# Patient Record
Sex: Female | Born: 1945 | Race: White | Hispanic: No | Marital: Single | Smoking: Former smoker
Health system: Southern US, Community
[De-identification: ages and names within clinical notes are randomized; demographics above are authoritative.]

## PROBLEM LIST (undated history)

## (undated) DIAGNOSIS — I10 Essential (primary) hypertension: Secondary | ICD-10-CM

## (undated) DIAGNOSIS — E785 Hyperlipidemia, unspecified: Secondary | ICD-10-CM

## (undated) DIAGNOSIS — I639 Cerebral infarction, unspecified: Secondary | ICD-10-CM

## (undated) DIAGNOSIS — J449 Chronic obstructive pulmonary disease, unspecified: Secondary | ICD-10-CM

## (undated) DIAGNOSIS — I2510 Atherosclerotic heart disease of native coronary artery without angina pectoris: Secondary | ICD-10-CM

## (undated) HISTORY — DX: Atherosclerotic heart disease of native coronary artery without angina pectoris: I25.10

## (undated) HISTORY — DX: Essential (primary) hypertension: I10

---

## 2013-09-20 ENCOUNTER — Encounter (HOSPITAL_COMMUNITY): Payer: Self-pay | Admitting: *Deleted

## 2013-09-20 ENCOUNTER — Emergency Department (HOSPITAL_COMMUNITY): Payer: Medicaid - Out of State

## 2013-09-20 ENCOUNTER — Inpatient Hospital Stay (HOSPITAL_COMMUNITY)
Admission: EM | Admit: 2013-09-20 | Discharge: 2013-09-25 | DRG: 247 | Disposition: A | Discharge status: Home / Self Care | Payer: Medicaid - Out of State | Attending: Internal Medicine | Admitting: Internal Medicine

## 2013-09-20 DIAGNOSIS — I2510 Atherosclerotic heart disease of native coronary artery without angina pectoris: Secondary | ICD-10-CM | POA: Diagnosis present

## 2013-09-20 DIAGNOSIS — Y831 Surgical operation with implant of artificial internal device as the cause of abnormal reaction of the patient, or of later complication, without mention of misadventure at the time of the procedure: Secondary | ICD-10-CM | POA: Diagnosis present

## 2013-09-20 DIAGNOSIS — E785 Hyperlipidemia, unspecified: Secondary | ICD-10-CM | POA: Diagnosis present

## 2013-09-20 DIAGNOSIS — Z7902 Long term (current) use of antithrombotics/antiplatelets: Secondary | ICD-10-CM

## 2013-09-20 DIAGNOSIS — N179 Acute kidney failure, unspecified: Secondary | ICD-10-CM | POA: Diagnosis present

## 2013-09-20 DIAGNOSIS — I252 Old myocardial infarction: Secondary | ICD-10-CM

## 2013-09-20 DIAGNOSIS — I4949 Other premature depolarization: Secondary | ICD-10-CM | POA: Diagnosis not present

## 2013-09-20 DIAGNOSIS — J4489 Other specified chronic obstructive pulmonary disease: Secondary | ICD-10-CM | POA: Diagnosis present

## 2013-09-20 DIAGNOSIS — J449 Chronic obstructive pulmonary disease, unspecified: Secondary | ICD-10-CM | POA: Diagnosis present

## 2013-09-20 DIAGNOSIS — N189 Chronic kidney disease, unspecified: Secondary | ICD-10-CM | POA: Diagnosis present

## 2013-09-20 DIAGNOSIS — Z86718 Personal history of other venous thrombosis and embolism: Secondary | ICD-10-CM

## 2013-09-20 DIAGNOSIS — R079 Chest pain, unspecified: Secondary | ICD-10-CM

## 2013-09-20 DIAGNOSIS — Z79899 Other long term (current) drug therapy: Secondary | ICD-10-CM

## 2013-09-20 DIAGNOSIS — Z7982 Long term (current) use of aspirin: Secondary | ICD-10-CM

## 2013-09-20 DIAGNOSIS — I214 Non-ST elevation (NSTEMI) myocardial infarction: Principal | ICD-10-CM | POA: Diagnosis present

## 2013-09-20 DIAGNOSIS — I129 Hypertensive chronic kidney disease with stage 1 through stage 4 chronic kidney disease, or unspecified chronic kidney disease: Secondary | ICD-10-CM | POA: Diagnosis present

## 2013-09-20 DIAGNOSIS — I10 Essential (primary) hypertension: Secondary | ICD-10-CM

## 2013-09-20 DIAGNOSIS — E876 Hypokalemia: Secondary | ICD-10-CM | POA: Diagnosis not present

## 2013-09-20 DIAGNOSIS — Z87891 Personal history of nicotine dependence: Secondary | ICD-10-CM

## 2013-09-20 DIAGNOSIS — F411 Generalized anxiety disorder: Secondary | ICD-10-CM | POA: Diagnosis present

## 2013-09-20 DIAGNOSIS — T82897A Other specified complication of cardiac prosthetic devices, implants and grafts, initial encounter: Secondary | ICD-10-CM | POA: Diagnosis present

## 2013-09-20 DIAGNOSIS — Z9861 Coronary angioplasty status: Secondary | ICD-10-CM

## 2013-09-20 HISTORY — DX: Chronic obstructive pulmonary disease, unspecified: J44.9

## 2013-09-20 HISTORY — DX: Hyperlipidemia, unspecified: E78.5

## 2013-09-20 HISTORY — DX: Cerebral infarction, unspecified: I63.9

## 2013-09-20 LAB — COMPREHENSIVE METABOLIC PANEL
ALT: 8 U/L (ref 0–35)
AST: 13 U/L (ref 0–37)
Albumin: 3.8 g/dL (ref 3.5–5.2)
Alkaline Phosphatase: 98 U/L (ref 39–117)
CO2: 21 mEq/L (ref 19–32)
Calcium: 9.1 mg/dL (ref 8.4–10.5)
Chloride: 103 mEq/L (ref 96–112)
Creatinine, Ser: 0.99 mg/dL (ref 0.50–1.10)
GFR calc Af Amer: 67 mL/min — ABNORMAL LOW (ref >90)
GFR calc non Af Amer: 58 mL/min — ABNORMAL LOW (ref >90)
Glucose, Bld: 133 mg/dL — ABNORMAL HIGH (ref 70–99)
Sodium: 138 mEq/L (ref 135–145)
Total Bilirubin: 0.4 mg/dL (ref 0.3–1.2)

## 2013-09-20 LAB — CBC WITH DIFFERENTIAL/PLATELET
Basophils Absolute: 0 10*3/uL (ref 0.0–0.1)
Eosinophils Relative: 1 % (ref 0–5)
HCT: 40 % (ref 36.0–46.0)
Lymphocytes Relative: 27 % (ref 12–46)
Lymphs Abs: 2.1 10*3/uL (ref 0.7–4.0)
MCHC: 35.8 g/dL (ref 30.0–36.0)
MCV: 94.6 fL (ref 78.0–100.0)
Monocytes Absolute: 0.4 10*3/uL (ref 0.1–1.0)
Neutro Abs: 5.1 10*3/uL (ref 1.7–7.7)
Platelets: 181 10*3/uL (ref 150–400)
RBC: 4.23 MIL/uL (ref 3.87–5.11)
WBC: 7.8 10*3/uL (ref 4.0–10.5)

## 2013-09-20 MED ORDER — NITROGLYCERIN 0.4 MG SL SUBL: 0.4000 mg | SUBLINGUAL_TABLET | SUBLINGUAL | Status: DC | PRN

## 2013-09-20 MED ORDER — HEPARIN (PORCINE) IN NACL 100-0.45 UNIT/ML-% IJ SOLN
800.0000 [IU]/h | INTRAMUSCULAR | Status: DC
  Administered 2013-09-20: 700 [IU]/h via INTRAVENOUS
  Filled 2013-09-20 (×2): qty 250

## 2013-09-20 MED ORDER — IOHEXOL 350 MG/ML SOLN
100.0000 mL | Freq: Once | INTRAVENOUS | Status: AC | PRN
  Administered 2013-09-20: 100 mL via INTRAVENOUS

## 2013-09-20 MED ORDER — NITROGLYCERIN 0.4 MG SL SUBL
SUBLINGUAL_TABLET | SUBLINGUAL | Status: AC
  Administered 2013-09-20: 21:00:00
  Filled 2013-09-20: qty 25

## 2013-09-20 MED ORDER — ASPIRIN 81 MG PO CHEW
324.0000 mg | CHEWABLE_TABLET | Freq: Once | ORAL | Status: AC
  Administered 2013-09-20: 324 mg via ORAL
  Filled 2013-09-20: qty 4

## 2013-09-20 MED ORDER — HEPARIN BOLUS VIA INFUSION
3500.0000 [IU] | Freq: Once | INTRAVENOUS | Status: AC
  Administered 2013-09-20: 3500 [IU] via INTRAVENOUS
  Filled 2013-09-20: qty 3500

## 2013-09-20 NOTE — ED Notes (Signed)
The pt reports that she has too much iron in her blood

## 2013-09-20 NOTE — ED Notes (Signed)
Dr. Jodi Mourning aware of pt Troponin level- pt denies any chest pain at this time.  MD at bedside.  IV in place.

## 2013-09-20 NOTE — ED Notes (Signed)
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  She has had intermittent lt sided chest pain since then.  She was supposed to see a doctor in [place] where she lives but her sister wanted her to come here

## 2013-09-20 NOTE — ED Provider Notes (Signed)
CSN: [id number]     Arrival date & time 09/20/13  1918 History   First MD Initiated Contact with Patient 09/20/13 2116     Chief Complaint  Patient presents with  . Chest Pain   (Consider location/radiation/quality/duration/timing/severity/associated sxs/prior Treatment) Patient is a 67 y.o. female presenting with chest pain. The history is provided by the patient. No language interpreter was used.  Chest Pain Pain location:  Substernal area Pain quality: pressure   Pain radiates to:  L arm Pain radiates to the back: no   Pain severity:  Mild Onset quality:  Gradual Duration: intermittent for months but worse over the last 3 days. Timing:  Intermittent Progression:  Waxing and waning Context: at rest   Worsened by:  Nothing tried Associated symptoms: fatigue and shortness of breath   Associated symptoms: no abdominal pain, no diaphoresis, no dizziness, no fever, no nausea, no palpitations and not vomiting     Past Medical History  Diagnosis Date  . Hypertension   . Coronary artery disease   . Stroke   . MI (myocardial infarction)    History reviewed. No pertinent past surgical history. No family history on file. History  Substance Use Topics  . Smoking status: Former Games developer  . Smokeless tobacco: Not on file  . Alcohol Use: No   OB History   Grav Para Term Preterm Abortions TAB SAB Ect Mult Living                 Review of Systems  Constitutional: Positive for fatigue. Negative for fever, diaphoresis and activity change.  Respiratory: Positive for chest tightness and shortness of breath.   Cardiovascular: Positive for chest pain. Negative for palpitations and leg swelling.  Gastrointestinal: Negative for nausea, vomiting and abdominal pain.  Neurological: Negative for dizziness and syncope.  All other systems reviewed and are negative.    Allergies  Review of patient's allergies indicates no known allergies.  Home Medications   Current Outpatient Rx  Name   Route  Sig  Dispense  Refill  . aspirin 325 MG tablet   Oral   Take 325 mg by mouth daily.         . enalapril (VASOTEC) 10 MG tablet   Oral   Take 10 mg by mouth 2 (two) times daily.         . furosemide (LASIX) 20 MG tablet   Oral   Take 20 mg by mouth daily.         Marland Kitchen HYDROcodone-acetaminophen (NORCO) 10-325 MG per tablet   Oral   Take 1 tablet by mouth every 6 (six) hours as needed for pain.         . metoprolol (LOPRESSOR) 100 MG tablet   Oral   Take 100 mg by mouth 2 (two) times daily.         Marland Kitchen omeprazole (PRILOSEC) 20 MG capsule   Oral   Take 20 mg by mouth daily.         . pravastatin (PRAVACHOL) 40 MG tablet   Oral   Take 40 mg by mouth daily.         . temazepam (RESTORIL) 15 MG capsule   Oral   Take 15 mg by mouth at bedtime as needed for sleep.          BP 150/109  Pulse 64  Temp(Src) 98.4 F (36.9 C) (Oral)  Resp 18  Wt 128 lb 1.6 oz (58.106 kg)  SpO2 99% Physical Exam  Vitals  reviewed. Constitutional: She is oriented to person, place, and time. She appears well-developed and well-nourished. No distress.  HENT:  Head: Normocephalic.  Cardiovascular: Normal rate, regular rhythm, normal heart sounds and intact distal pulses.   Pulmonary/Chest: Effort normal. She has rales in the right lower field and the left lower field.  Abdominal: Soft. Bowel sounds are normal. She exhibits no distension. There is no tenderness.  Musculoskeletal: She exhibits no edema and no tenderness.       Right lower leg: She exhibits no tenderness and no swelling.       Left lower leg: She exhibits no tenderness and no swelling.  Neurological: She is alert and oriented to person, place, and time.  Skin: Skin is warm and dry.    ED Course  Procedures (including critical care time) Labs Review Labs Reviewed  COMPREHENSIVE METABOLIC PANEL - Abnormal; Notable for the following:    Glucose, Bld 133 (*)    GFR calc non Af Amer 58 (*)    GFR calc Af Amer 67  (*)    All other components within normal limits  TROPONIN I - Abnormal; Notable for the following:    Troponin I 3.18 (*)    All other components within normal limits  CBC WITH DIFFERENTIAL  TROPONIN I   Imaging Review Dg Chest 2 View  09/20/2013   CLINICAL DATA:  Three day history of chest pain and upper back pain. Shortness of breath. Former smoker. Current history of hypertension. Prior history of MI and DVT.  EXAM: CHEST  2 VIEW  COMPARISON:  None.  FINDINGS: Cardiac silhouette mildly enlarged. Thoracic aorta atherosclerotic. Hilar and mediastinal contours otherwise unremarkable. Emphysematous changes in the upper lobes. Scattered areas of linear atelectasis or scarring in the inferior right upper lobe, right middle lobe, lingula, and both lower lobes. No confluent airspace consolidation. Normal pulmonary vascularity. No pleural effusions. Old appearing compression fracture of what I believe is the T8 vertebral body on the order of 40% or so. Right coronary artery stent.  IMPRESSION: Mild cardiomegaly. Scattered areas of linear atelectasis and or scarring in both lungs. COPD/emphysema. No acute cardiopulmonary disease otherwise.   Electronically Signed   By: Hulan Saas   On: 09/20/2013 20:32    Date: 09/20/2013  Rate: 76  Rhythm: normal sinus rhythm  QRS Axis: normal  Intervals: QT prolonged  ST/T Wave abnormalities: normal  Conduction Disutrbances:none  Narrative Interpretation: sinus rhythm, QT prolonged, LVH  Old EKG Reviewed: none available   MDM  No diagnosis found.  67 y/o female with history of MI s/p stent placement presenting with chest pressure. Reports intermittent symptoms over the last 2 months that occur at rest. Denies exertional symptoms. Pressure occuring more frequently over the last 3 days. Denies infectious symptoms. She was seen at [facility] where she reported having LE [place] and was told she had DVTs but was started on no anticoagulation. No clinical evidence of  DVT on my exam. Troponin elevated 3.18. ASA given. CXR with mild cardiomegaly, no acute process. Cardiology consulted and heparin infusion began. CT PE study to r/o PE was negative. Patient admitted to Cardiology for further management of NSTEMI.   Labs and imaging reviewed in my medical decision making. Patient discussed with my attending, Dr. Jodi Mourning.    Abagail Kitchens, MD 09/21/13 0159  Medical screening examination/treatment/procedure(s) were conducted as a shared visit with non-physician practitioner(s) or resident  and myself.  I personally evaluated the patient during the encounter and agree with the findings  and plan unless otherwise indicated.    Cardiac hx.  Worsening cp recently. EKG reviewed with resident, no acute findings.  Exam lungs clear, RRR, no leg swelling/ pain.  Troponin elevated.  Cardiology consulted for concern for CAD/ NSTEMI.  CT pe neg. Heparin and admission.  Pain improved in ED.  NSTEMI,  Comfortable on recheck.   NSTEMI, Chest pani  Enid Skeens, MD 09/22/13 [phone]

## 2013-09-20 NOTE — Consult Note (Signed)
ANTICOAGULATION CONSULT NOTE - Initial Consult  Pharmacy Consult for Heparin Indication: chest pain/ACS  No Known Allergies  Patient Measurements: Height: 5' 0.5" (153.7 cm) Weight: 128 lb 1.6 oz (58.106 kg) IBW/kg (Calculated) : 46.65 Heparin Dosing Weight: 58kg  Vital Signs: Temp: 98.4 F (36.9 C) (09/26 1929) Temp src: Oral (09/26 1929) BP: 159/87 mmHg (09/26 2136) Pulse Rate: 64 (09/26 1929)  Labs:  Recent Labs  09/20/13 1936 09/20/13 1937  HGB 14.3  --   HCT 40.0  --   PLT 181  --   CREATININE 0.99  --   TROPONINI  --  3.18*    Estimated Creatinine Clearance: 44.7 ml/min (by C-G formula based on Cr of 0.99).   Medical History: Past Medical History  Diagnosis Date  . Hypertension   . Coronary artery disease   . Stroke   . MI (myocardial infarction)     Assessment: 67yof diagnosed with DVTs earlier this month (but only on aspirin pta, she says blood thinners hurt her stomach) presents to the ED with chest pain. Initial troponin is positive at 3.18. She will begin IV heparin. Baseline labs wnl.  Goal of Therapy:  Heparin level 0.3-0.7 units/ml Monitor platelets by anticoagulation protocol: Yes   Plan:  1) Heparin bolus 3500 units x 1 2) Heparin drip at 700 units/hr 3) 6 hour heparin level  4) Daily heparin level and CBC  Fredrik Rigger 09/20/2013,9:47 PM

## 2013-09-20 NOTE — H&P (Signed)
Savannah Carlson is an 67 y.o. female.   Chief Complaint: chronic chest pain, more fatigue  HPI: 68 yo woman with PMH of prior MI and 3 stents approximately 10 years ago, hypertension, here with 1-2 weeks of chest pressure typically at rest, not necessarily exertional who comes in today with continued chest pressure and associated worsening fatigue. She's had intermittent chest pain since her MI/stents in 1998 (at [facility]) but probably only 1-3x a month. Over the last 1-2 months she's had more She was recently seen in [place] at [facility] and was diagnosed with lower extremity blood clots according to patient but she was not started on anticoagulation. She gets very winded with a smothering feeling walking up the 1 flight of stairs she has to do walk up at her home. Otherwise, no syncope, no recent bleeding issues (she had issues have a gynecological surgery several years ago), no travel, no infections.   Past Medical History  Diagnosis Date  . Hypertension   . Coronary artery disease   . Stroke   . MI (myocardial infarction)     History reviewed. No pertinent past surgical history.  History reviewed. No pertinent family history. Social History:  reports that she has quit smoking. She does not have any smokeless tobacco history on file. She reports that she does not drink alcohol. Her drug history is not on file.  Allergies: No Known Allergies   (Not in a hospital admission) Current Facility-Administered Medications  Medication Dose Route Frequency Provider Last Rate Last Dose  . heparin ADULT infusion 100 units/mL (25000 units/250 mL)  700 Units/hr Intravenous Continuous Fredrik Rigger, [facility] 7 mL/hr at 09/20/13 2209 700 Units/hr at 09/20/13 2209  . nitroGLYCERIN (NITROSTAT) SL tablet 0.4 mg  0.4 mg Sublingual Q5 min PRN Abagail Kitchens, MD        Results for orders placed during the hospital encounter of 09/20/13 (from the past 48 hour(s))  CBC WITH DIFFERENTIAL     Status: None   Collection Time     09/20/13  7:36 PM      Result Value Range   WBC 7.8  4.0 - 10.5 K/uL   RBC 4.23  3.87 - 5.11 MIL/uL   Hemoglobin 14.3  12.0 - 15.0 g/dL   HCT 57.8  46.9 - 62.9 %   MCV 94.6  78.0 - 100.0 fL   MCH 33.8  26.0 - 34.0 pg   MCHC 35.8  30.0 - 36.0 g/dL   RDW 52.8  41.3 - 24.4 %   Platelets 181  150 - 400 K/uL   Neutrophils Relative % 66  43 - 77 %   Neutro Abs 5.1  1.7 - 7.7 K/uL   Lymphocytes Relative 27  12 - 46 %   Lymphs Abs 2.1  0.7 - 4.0 K/uL   Monocytes Relative 6  3 - 12 %   Monocytes Absolute 0.4  0.1 - 1.0 K/uL   Eosinophils Relative 1  0 - 5 %   Eosinophils Absolute 0.1  0.0 - 0.7 K/uL   Basophils Relative 0  0 - 1 %   Basophils Absolute 0.0  0.0 - 0.1 K/uL  COMPREHENSIVE METABOLIC PANEL     Status: Abnormal   Collection Time    09/20/13  7:36 PM      Result Value Range   Sodium 138  135 - 145 mEq/L   Potassium 3.8  3.5 - 5.1 mEq/L   Chloride 103  96 - 112 mEq/L   CO2  21  19 - 32 mEq/L   Glucose, Bld 133 (*) 70 - 99 mg/dL   BUN 22  6 - 23 mg/dL   Creatinine, Ser 4.09  0.50 - 1.10 mg/dL   Calcium 9.1  8.4 - 81.1 mg/dL   Total Protein 7.2  6.0 - 8.3 g/dL   Albumin 3.8  3.5 - 5.2 g/dL   AST 13  0 - 37 U/L   ALT 8  0 - 35 U/L   Alkaline Phosphatase 98  39 - 117 U/L   Total Bilirubin 0.4  0.3 - 1.2 mg/dL   GFR calc non Af Amer 58 (*) >90 mL/min   GFR calc Af Amer 67 (*) >90 mL/min   Comment: (NOTE)     The eGFR has been calculated using the CKD EPI equation.     This calculation has not been validated in all clinical situations.     eGFR's persistently <90 mL/min signify possible Chronic Kidney     Disease.  TROPONIN I     Status: Abnormal   Collection Time    09/20/13  7:37 PM      Result Value Range   Troponin I 3.18 (*) <0.30 ng/mL   Comment:            Due to the release kinetics of cTnI,     a negative result within the first hours     of the onset of symptoms does not rule out     myocardial infarction with certainty.     If myocardial infarction is  still suspected,     repeat the test at appropriate intervals.     REPEATED TO VERIFY     CRITICAL RESULT CALLED TO, READ BACK BY AND VERIFIED WITH:     B SANGLANG,RN 2100 09/20/13 WBOND   Dg Chest 2 View  09/20/2013   CLINICAL DATA:  Three day history of chest pain and upper back pain. Shortness of breath. Former smoker. Current history of hypertension. Prior history of MI and DVT.  EXAM: CHEST  2 VIEW  COMPARISON:  None.  FINDINGS: Cardiac silhouette mildly enlarged. Thoracic aorta atherosclerotic. Hilar and mediastinal contours otherwise unremarkable. Emphysematous changes in the upper lobes. Scattered areas of linear atelectasis or scarring in the inferior right upper lobe, right middle lobe, lingula, and both lower lobes. No confluent airspace consolidation. Normal pulmonary vascularity. No pleural effusions. Old appearing compression fracture of what I believe is the T8 vertebral body on the order of 40% or so. Right coronary artery stent.  IMPRESSION: Mild cardiomegaly. Scattered areas of linear atelectasis and or scarring in both lungs. COPD/emphysema. No acute cardiopulmonary disease otherwise.   Electronically Signed   By: Hulan Saas   On: 09/20/2013 20:32    Review of Systems  Constitutional: Positive for malaise/fatigue. Negative for fever and chills.  HENT: Positive for hearing loss. Negative for ear pain and neck pain.   Eyes: Negative for double vision, photophobia and pain.  Respiratory: Positive for shortness of breath. Negative for hemoptysis and sputum production.   Cardiovascular: Positive for chest pain and orthopnea. Negative for palpitations.  Gastrointestinal: Negative for nausea, vomiting and abdominal pain.  Genitourinary: Negative for urgency and frequency.  Musculoskeletal: Positive for back pain and joint pain. Negative for myalgias.  Skin: Negative for itching.  Neurological: Negative for dizziness, tingling, tremors and headaches.  Endo/Heme/Allergies:  Negative for polydipsia. Bruises/bleeds easily.  Psychiatric/Behavioral: Negative for suicidal ideas, hallucinations and substance abuse.    Blood pressure  159/87, pulse 64, temperature 98.4 F (36.9 C), temperature source Oral, resp. rate 18, height 5' 0.5" (1.537 m), weight 58.106 kg (128 lb 1.6 oz), SpO2 97.00%. Physical Exam  Nursing note and vitals reviewed. Constitutional: She is oriented to person, place, and time. She appears well-developed and well-nourished. No distress.  HENT:  Head: Normocephalic and atraumatic.  Nose: Nose normal.  Mouth/Throat: Oropharynx is clear and moist. No oropharyngeal exudate.  Eyes: Conjunctivae and EOM are normal. Pupils are equal, round, and reactive to light. No scleral icterus.  Neck: Normal range of motion. Neck supple. JVD present. No thyromegaly present.  JVD 3 cm above clavicle with slight HJR  Cardiovascular: Normal rate, regular rhythm, normal heart sounds and intact distal pulses.  Exam reveals no gallop.   No murmur heard. Respiratory: Effort normal. She has no rales.  Bibasilar rales that clear superiorly  GI: Soft. Bowel sounds are normal. There is tenderness. There is no rebound.  Mildly tender  Musculoskeletal: Normal range of motion. She exhibits tenderness. She exhibits no edema.  Mildly tender legs  Neurological: She is alert and oriented to person, place, and time. No cranial nerve deficit. Coordination normal.  Skin: Skin is warm and dry. No rash noted. She is not diaphoretic. No erythema.  Psychiatric: She has a normal mood and affect. Her behavior is normal. Thought content normal.    Labs reviewed; na 138, K 3.8, bun/cr 22/1.0, cl 103, bicarb 21, Troponin 3.2 Albumin 3.8 Chest x-ray: mild CM, large lung fields, maybe slight cephalization ECG: PVC, nsr, nl axis, prlonged QT, LVH with repolarization  Problem List Chest Pain Elevated Troponin ? Blood clots in lower extremities Coronary artery disease with prior  stents Hypertension LVH on ECG Assessment/Plan 67 yo woman with PMH of hypertension, CAD with 3 prior stents, recent diagnosis of lower extremity DVT here with chest pressure over the last 1-2 months now with associated fatigue and found to have elevated troponin. Heparin initiated in ER. Differential diagnosis is certainly NSTEMI, also potential for PE, fairly normal renal function so these are leading diagnoses. She's received 324 mg aspirin, heparin gtt initiated. ER to obtain CTA given history of DVT in legs and chronic symptoms.  - telemetry admission - continue home metoprolol 100 mg bid, enalapril 10 mg bid - hba1c, TSH, BNP, lipid panel - heparin gtt, asa 81 mg daily - 80 mg atorvastatin PO daily - trend cardiac markers - if CTA negative, likely LHC on Monday AM unless symptoms change  - NTG for any recurrent chest pain or hypertension - le doppler US to evaluate for DVTs given history at OSH - echo in AM ordered  Brittin Janik 09/20/2013, 9:43 PM

## 2013-09-21 DIAGNOSIS — I517 Cardiomegaly: Secondary | ICD-10-CM

## 2013-09-21 DIAGNOSIS — I214 Non-ST elevation (NSTEMI) myocardial infarction: Secondary | ICD-10-CM

## 2013-09-21 DIAGNOSIS — M79609 Pain in unspecified limb: Secondary | ICD-10-CM

## 2013-09-21 DIAGNOSIS — N189 Chronic kidney disease, unspecified: Secondary | ICD-10-CM

## 2013-09-21 DIAGNOSIS — R079 Chest pain, unspecified: Secondary | ICD-10-CM | POA: Insufficient documentation

## 2013-09-21 DIAGNOSIS — I10 Essential (primary) hypertension: Secondary | ICD-10-CM

## 2013-09-21 DIAGNOSIS — N289 Disorder of kidney and ureter, unspecified: Secondary | ICD-10-CM

## 2013-09-21 LAB — BASIC METABOLIC PANEL
CO2: 25 mEq/L (ref 19–32)
Calcium: 9.2 mg/dL (ref 8.4–10.5)
Chloride: 100 mEq/L (ref 96–112)
GFR calc Af Amer: 26 mL/min — ABNORMAL LOW (ref >90)
Glucose, Bld: 157 mg/dL — ABNORMAL HIGH (ref 70–99)
Sodium: 136 mEq/L (ref 135–145)

## 2013-09-21 LAB — LIPID PANEL
Cholesterol: 170 mg/dL (ref 0–200)
HDL: 29 mg/dL — ABNORMAL LOW (ref >39)
LDL Cholesterol: 87 mg/dL (ref 0–99)
Total CHOL/HDL Ratio: 5.9 RATIO
Triglycerides: 272 mg/dL — ABNORMAL HIGH (ref <150)
VLDL: 54 mg/dL — ABNORMAL HIGH (ref 0–40)

## 2013-09-21 LAB — CBC
Hemoglobin: 13.4 g/dL (ref 12.0–15.0)
MCH: 33.8 pg (ref 26.0–34.0)
MCHC: 35.8 g/dL (ref 30.0–36.0)
Platelets: 140 10*3/uL — ABNORMAL LOW (ref 150–400)

## 2013-09-21 LAB — HEMOGLOBIN A1C
Hgb A1c MFr Bld: 5.7 % — ABNORMAL HIGH (ref <5.7)
Mean Plasma Glucose: 117 mg/dL — ABNORMAL HIGH (ref <117)

## 2013-09-21 LAB — PROTIME-INR: INR: 1.06 (ref 0.00–1.49)

## 2013-09-21 LAB — TROPONIN I
Troponin I: 3.15 ng/mL (ref <0.30)
Troponin I: 3.18 ng/mL (ref <0.30)

## 2013-09-21 LAB — APTT: aPTT: 51 seconds — ABNORMAL HIGH (ref 24–37)

## 2013-09-21 MED ORDER — HEPARIN (PORCINE) IN NACL 100-0.45 UNIT/ML-% IJ SOLN
950.0000 [IU]/h | INTRAMUSCULAR | Status: DC
  Administered 2013-09-21 – 2013-09-23 (×2): 950 [IU]/h via INTRAVENOUS
  Filled 2013-09-21 (×3): qty 250

## 2013-09-21 MED ORDER — SODIUM CHLORIDE 0.9 % IV SOLN
INTRAVENOUS | Status: DC
  Administered 2013-09-21 – 2013-09-22 (×2): via INTRAVENOUS

## 2013-09-21 MED ORDER — TEMAZEPAM 15 MG PO CAPS
15.0000 mg | ORAL_CAPSULE | Freq: Every evening | ORAL | Status: DC | PRN
  Administered 2013-09-21 – 2013-09-22 (×3): 15 mg via ORAL
  Filled 2013-09-21 (×3): qty 1

## 2013-09-21 MED ORDER — HYDROCODONE-ACETAMINOPHEN 10-325 MG PO TABS
1.0000 | ORAL_TABLET | Freq: Four times a day (QID) | ORAL | Status: DC | PRN
  Administered 2013-09-21 – 2013-09-25 (×13): 1 via ORAL
  Filled 2013-09-21 (×13): qty 1

## 2013-09-21 MED ORDER — METOPROLOL TARTRATE 100 MG PO TABS
100.0000 mg | ORAL_TABLET | Freq: Two times a day (BID) | ORAL | Status: DC
  Administered 2013-09-21 – 2013-09-25 (×10): 100 mg via ORAL
  Filled 2013-09-21 (×9): qty 1
  Filled 2013-09-21: qty 4
  Filled 2013-09-21 (×2): qty 1

## 2013-09-21 MED ORDER — ASPIRIN EC 81 MG PO TBEC
81.0000 mg | DELAYED_RELEASE_TABLET | Freq: Every day | ORAL | Status: DC
  Administered 2013-09-21 – 2013-09-25 (×5): 81 mg via ORAL
  Filled 2013-09-21 (×5): qty 1

## 2013-09-21 MED ORDER — ACETAMINOPHEN 325 MG PO TABS: 650.0000 mg | ORAL_TABLET | ORAL | Status: DC | PRN

## 2013-09-21 MED ORDER — ONDANSETRON HCL 4 MG/2ML IJ SOLN: 4.0000 mg | Freq: Four times a day (QID) | INTRAMUSCULAR | Status: DC | PRN

## 2013-09-21 MED ORDER — HEPARIN BOLUS VIA INFUSION
1000.0000 [IU] | Freq: Once | INTRAVENOUS | Status: AC
  Administered 2013-09-21: 1000 [IU] via INTRAVENOUS
  Filled 2013-09-21: qty 1000

## 2013-09-21 MED ORDER — ENALAPRIL MALEATE 10 MG PO TABS
10.0000 mg | ORAL_TABLET | Freq: Two times a day (BID) | ORAL | Status: DC
  Administered 2013-09-21 (×2): 10 mg via ORAL
  Filled 2013-09-21 (×3): qty 1

## 2013-09-21 MED ORDER — AMLODIPINE BESYLATE 5 MG PO TABS
5.0000 mg | ORAL_TABLET | Freq: Every day | ORAL | Status: DC
  Administered 2013-09-21 – 2013-09-25 (×5): 5 mg via ORAL
  Filled 2013-09-21 (×5): qty 1

## 2013-09-21 MED ORDER — PANTOPRAZOLE SODIUM 40 MG PO TBEC
40.0000 mg | DELAYED_RELEASE_TABLET | Freq: Every day | ORAL | Status: DC
  Administered 2013-09-21 – 2013-09-25 (×5): 40 mg via ORAL
  Filled 2013-09-21 (×5): qty 1

## 2013-09-21 MED ORDER — ATORVASTATIN CALCIUM 80 MG PO TABS
80.0000 mg | ORAL_TABLET | Freq: Every day | ORAL | Status: DC
  Administered 2013-09-21 – 2013-09-24 (×5): 80 mg via ORAL
  Filled 2013-09-21 (×7): qty 1

## 2013-09-21 MED ORDER — NITROGLYCERIN 0.4 MG SL SUBL: 0.4000 mg | SUBLINGUAL_TABLET | SUBLINGUAL | Status: DC | PRN

## 2013-09-21 NOTE — Progress Notes (Signed)
ANTICOAGULATION CONSULT NOTE - Follow Up Consult  Pharmacy Consult for heparin Indication: chest pain/ACS  Labs:  Recent Labs  09/20/13 1936 09/20/13 1937 09/20/13 2127 09/21/13 0215 09/21/13 0602  HGB 14.3  --   --   --  13.4  HCT 40.0  --   --   --  37.4  PLT 181  --   --   --  140*  APTT  --   --   --   --  51*  LABPROT  --   --   --   --  13.6  INR  --   --   --   --  1.06  HEPARINUNFRC  --   --   --   --  0.21*  CREATININE 0.99  --   --   --   --   TROPONINI  --  3.18* 3.19* 3.15*  --     Assessment: 67yo female subtherapeutic on heparin with initial dosing for CP.  Goal of Therapy:  Heparin level 0.3-0.7 units/ml   Plan:  Will increase heparin gtt by 2 units/kg/hr to 800 units/hr and check level in 8hr.  Vernard Gambles, PharmD, BCPS  09/21/2013,7:04 AM

## 2013-09-21 NOTE — Progress Notes (Addendum)
Patient Name: Savannah Carlson Date of Encounter: 09/21/2013     Active Problems: NSTEMI Acute renal failure  SUBJECTIVE  The patient is complaining on right lower back pain, no chest pain  CURRENT MEDS . aspirin EC  81 mg Oral Daily  . atorvastatin  80 mg Oral q1800  . enalapril  10 mg Oral BID  . metoprolol  100 mg Oral BID  . pantoprazole  40 mg Oral Daily    OBJECTIVE  Filed Vitals:   09/20/13 2336 09/21/13 0145 09/21/13 0449 09/21/13 1012  BP: 167/80  106/52 160/77  Pulse: 58 61 57 65  Temp: 97.8 F (36.6 C)  97.7 F (36.5 C) 97.9 F (36.6 C)  TempSrc: Oral  Oral Oral  Resp: 20  18 18   Height: 5' 0.5" (1.537 m)     Weight: 126 lb 11.2 oz (57.471 kg)  127 lb 3.6 oz (57.71 kg)   SpO2: 95%  94% 97%    Intake/Output Summary (Last 24 hours) at 09/21/13 1152 Last data filed at 09/21/13 0830  Gross per 24 hour  Intake    360 ml  Output      0 ml  Net    360 ml   Filed Weights   09/20/13 2004 09/20/13 2336 09/21/13 0449  Weight: 128 lb 1.6 oz (58.106 kg) 126 lb 11.2 oz (57.471 kg) 127 lb 3.6 oz (57.71 kg)    PHYSICAL EXAM  General: Pleasant, NAD. Neuro: Alert and oriented X 3. Moves all extremities spontaneously. Psych: Normal affect. HEENT:  Normal  Neck: Supple without bruits or JVD. Lungs:  Resp regular and unlabored, CTA. Tenderness in the lower posterior portion of the right chest wall Heart: RRR no s3, s4, or murmurs. Abdomen: Soft, non-tender, non-distended, BS + x 4.  Extremities: No clubbing, cyanosis or edema. DP/PT/Radials 2+ and equal bilaterally.  Accessory Clinical Findings  CBC  Recent Labs  09/20/13 1936 09/21/13 0602  WBC 7.8 6.6  NEUTROABS 5.1  --   HGB 14.3 13.4  HCT 40.0 37.4  MCV 94.6 94.4  PLT 181 140*   Basic Metabolic Panel  Recent Labs  09/20/13 1936 09/21/13 0602  NA 138 136  K 3.8 3.6  CL 103 100  CO2 21 25  GLUCOSE 133* 157*  BUN 22 27*  CREATININE 0.99 2.17*  CALCIUM 9.1 9.2   Liver Function  Tests  Recent Labs  09/20/13 1936  AST 13  ALT 8  ALKPHOS 98  BILITOT 0.4  PROT 7.2  ALBUMIN 3.8   No results found for this basename: LIPASE, AMYLASE,  in the last 72 hours Cardiac Enzymes  Recent Labs  09/20/13 2127 09/21/13 0215 09/21/13 0855  TROPONINI 3.19* 3.15* 3.18*    Recent Labs  09/21/13 0602  CHOL 170  HDL 29*  LDLCALC 87  TRIG 960*  CHOLHDL 5.9   TELE SR, HR 70', frequent PVCs  ECG  SR, 74 BPM, LVH, QTc 500  Radiology/Studies  Dg Chest 2 View  09/20/2013   IMPRESSION: Mild cardiomegaly. Scattered areas of linear atelectasis and or scarring in both lungs. COPD/emphysema. No acute cardiopulmonary disease otherwise.   Electronically Signed   By: Hulan Saas   On: 09/20/2013 20:32   Ct Angio Chest W/cm &/or Wo Cm  09/20/2013 IMPRESSION: No evidence of pulmonary emboli.  Cardiomegaly, coronary artery disease and emphysema.  Fractures of the anterolateral right fourth and fifth ribs - age indeterminate.  Correlate with pain.   Original Report Authenticated By: Tinnie Gens  Hu, M.D.   VASCULAR LAB  PRELIMINARY PRELIMINARY PRELIMINARY PRELIMINARY  Bilateral lower extremity venous Dopplers completed.  Preliminary report: There is no DVT or SVT noted in the bilateral lower extremities.  KANADY, CANDACE, RVT  09/21/2013, 12:01 PM   ASSESSMENT AND PLAN  Assessment/Plan   67 yo woman with h/o CAD, s/p PCI x 3 in years 1998, 1998, not followed by cardiology, h/o HTN, and recent ? Diagnosis of DVT not anticoagulated who presented with chest pressure over the last 1-2 months now with associated fatigue.  1. NSTEMI - the patient is on Heparin drip, she is now asymptomatic and has no acute ECG changes. She has prior interventions 16 years ago. 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 TTE today showed basal and mid inferior and inferoseptal hypokinesis, overall EF preserved, impaired relaxation  - iv  hydration and wait for improvement. - continue iv heparin drip - continue ASA 81 mg, hight dose atorvastatin, BB, hold ACEI (ARI)  2. 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 Hypertension - ACEI held - add amlodipine 5 mg QD  4. H/O DVT - preliminary report of today's [place] is negative, CT PE negative    Signed, Tobias Alexander, H MD 09/21/2013

## 2013-09-21 NOTE — Progress Notes (Signed)
Echocardiogram 2D Echocardiogram has been performed.  Dorothey Baseman 09/21/2013, 9:54 AM

## 2013-09-21 NOTE — Progress Notes (Signed)
ANTICOAGULATION CONSULT NOTE - Follow Up Consult  Pharmacy Consult for heparin Indication: chest pain/ACS  Labs:  Recent Labs  09/20/13 1936  09/20/13 2127 09/21/13 0215 09/21/13 0602 09/21/13 0855 09/21/13 1430  HGB 14.3  --   --   --  13.4  --   --   HCT 40.0  --   --   --  37.4  --   --   PLT 181  --   --   --  140*  --   --   APTT  --   --   --   --  51*  --   --   LABPROT  --   --   --   --  13.6  --   --   INR  --   --   --   --  1.06  --   --   HEPARINUNFRC  --   --   --   --  0.21*  --  0.21*  CREATININE 0.99  --   --   --  2.17*  --   --   TROPONINI  --   < > 3.19* 3.15*  --  3.18*  --   < > = values in this interval not displayed.  Assessment: 67yo female  on heparin for CP and heparin level is below goal (HL= 0.21) on 800 units/hr.   Goal of Therapy:  Heparin level 0.3-0.7 units/ml   Plan:  -Heparin bolus 1000 units then increase infusion to 950 units/hr -Heparin level in am (~ 8hrs post infusion change)  Harland German, Pharm D 09/21/2013 7:11 PM

## 2013-09-21 NOTE — Progress Notes (Signed)
VASCULAR LAB PRELIMINARY  PRELIMINARY  PRELIMINARY  PRELIMINARY  Bilateral lower extremity venous Dopplers completed.    Preliminary report:  There is no DVT or SVT noted in the bilateral lower extremities.  Sindia Kowalczyk, RVT 09/21/2013, 12:01 PM

## 2013-09-22 LAB — BASIC METABOLIC PANEL
Chloride: 107 mEq/L (ref 96–112)
Creatinine, Ser: 0.53 mg/dL (ref 0.50–1.10)
GFR calc Af Amer: >90 mL/min (ref >90)
GFR calc non Af Amer: >90 mL/min (ref >90)
Potassium: 3.4 mEq/L — ABNORMAL LOW (ref 3.5–5.1)

## 2013-09-22 LAB — HEPARIN LEVEL (UNFRACTIONATED)
Heparin Unfractionated: 0.36 IU/mL (ref 0.30–0.70)
Heparin Unfractionated: 0.37 IU/mL (ref 0.30–0.70)

## 2013-09-22 LAB — CBC
Platelets: 127 10*3/uL — ABNORMAL LOW (ref 150–400)
RBC: 3.79 MIL/uL — ABNORMAL LOW (ref 3.87–5.11)
RDW: 12.8 % (ref 11.5–15.5)
WBC: 4.6 10*3/uL (ref 4.0–10.5)

## 2013-09-22 MED ORDER — SODIUM CHLORIDE 0.9 % IV SOLN: 250.0000 mL | INTRAVENOUS | Status: DC | PRN

## 2013-09-22 MED ORDER — SODIUM CHLORIDE 0.9 % IV SOLN
1.0000 mL/kg/h | INTRAVENOUS | Status: DC
  Administered 2013-09-23: 1 mL/kg/h via INTRAVENOUS

## 2013-09-22 MED ORDER — SODIUM CHLORIDE 0.9 % IJ SOLN: 3.0000 mL | INTRAMUSCULAR | Status: DC | PRN

## 2013-09-22 MED ORDER — SODIUM CHLORIDE 0.9 % IJ SOLN
3.0000 mL | Freq: Two times a day (BID) | INTRAMUSCULAR | Status: DC
  Administered 2013-09-22 – 2013-09-23 (×2): 3 mL via INTRAVENOUS

## 2013-09-22 MED ORDER — LORAZEPAM 0.5 MG PO TABS
0.5000 mg | ORAL_TABLET | Freq: Four times a day (QID) | ORAL | Status: DC | PRN
  Administered 2013-09-23 – 2013-09-24 (×3): 0.5 mg via ORAL
  Filled 2013-09-22 (×4): qty 1

## 2013-09-22 MED ORDER — POTASSIUM CHLORIDE 20 MEQ/15ML (10%) PO LIQD
40.0000 meq | Freq: Once | ORAL | Status: AC
  Administered 2013-09-22: 40 meq via ORAL
  Filled 2013-09-22: qty 30

## 2013-09-22 NOTE — Progress Notes (Addendum)
Patient Name: Savannah Carlson Date of Encounter: 09/22/2013  Active Problems: NSTEMI Acute renal failure  SUBJECTIVE  The patient had another episode of chest pain yesterday.  CURRENT MEDS . amLODipine  5 mg Oral Daily  . aspirin EC  81 mg Oral Daily  . atorvastatin  80 mg Oral q1800  . metoprolol  100 mg Oral BID  . pantoprazole  40 mg Oral Daily    OBJECTIVE  Filed Vitals:   09/21/13 2044 09/21/13 2116 09/21/13 2146 09/22/13 0440  BP: 153/61 134/78 134/78 123/59  Pulse: 64 66 63 60  Temp: 98 F (36.7 C)   98.3 F (36.8 C)  TempSrc:      Resp: 18   16  Height:      Weight:    126 lb 1.6 oz (57.199 kg)  SpO2: 97%   95%    Intake/Output Summary (Last 24 hours) at 09/22/13 1000 Last data filed at 09/22/13 0900  Gross per 24 hour  Intake    760 ml  Output      0 ml  Net    760 ml   Filed Weights   09/20/13 2336 09/21/13 0449 09/22/13 0440  Weight: 126 lb 11.2 oz (57.471 kg) 127 lb 3.6 oz (57.71 kg) 126 lb 1.6 oz (57.199 kg)    PHYSICAL EXAM  General: Pleasant, NAD. Neuro: Alert and oriented X 3. Moves all extremities spontaneously. Psych: Normal affect. HEENT:  Normal  Neck: Supple without bruits or JVD. Lungs:  Resp regular and unlabored, CTA. Tenderness in the lower posterior portion of the right chest wall Heart: RRR no s3, s4, or murmurs. Abdomen: Soft, non-tender, non-distended, BS + x 4.  Extremities: No clubbing, cyanosis or edema. DP/PT/Radials 2+ and equal bilaterally.  Accessory Clinical Findings  CBC  Recent Labs  09/20/13 1936 09/21/13 0602 09/22/13 0615  WBC 7.8 6.6 4.6  NEUTROABS 5.1  --   --   HGB 14.3 13.4 12.4  HCT 40.0 37.4 36.2  MCV 94.6 94.4 95.5  PLT 181 140* 127*   Basic Metabolic Panel  Recent Labs  09/21/13 0602 09/22/13 0615  NA 136 141  K 3.6 3.4*  CL 100 107  CO2 25 25  GLUCOSE 157* 120*  BUN 27* 9  CREATININE 2.17* 0.53  CALCIUM 9.2 8.7   Liver Function Tests  Recent Labs  09/20/13 1936  AST 13  ALT  8  ALKPHOS 98  BILITOT 0.4  PROT 7.2  ALBUMIN 3.8   No results found for this basename: LIPASE, AMYLASE,  in the last 72 hours Cardiac Enzymes  Recent Labs  09/20/13 2127 09/21/13 0215 09/21/13 0855  TROPONINI 3.19* 3.15* 3.18*    Recent Labs  09/21/13 0602  CHOL 170  HDL 29*  LDLCALC 87  TRIG 161*  CHOLHDL 5.9   TELE SR, HR 70', frequent PVCs  ECG  SR, 74 BPM, LVH, QTc 500  Radiology/Studies  Dg Chest 2 View  09/20/2013   IMPRESSION: Mild cardiomegaly. Scattered areas of linear atelectasis and or scarring in both lungs. COPD/emphysema. No acute cardiopulmonary disease otherwise.   Electronically Signed   By: Hulan Saas   On: 09/20/2013 20:32   Ct Angio Chest W/cm &/or Wo Cm  09/20/2013 IMPRESSION: No evidence of pulmonary emboli.  Cardiomegaly, coronary artery disease and emphysema.  Fractures of the anterolateral right fourth and fifth ribs - age indeterminate.  Correlate with pain.   Original Report Authenticated By: Harmon Pier, M.D.   Duplex US B/L LE  veins 09/21/2013 - No evidence of deep vein or superficial thrombosis involving the right lower extremity and left lower extremity. - No evidence of Baker's cyst on the right or left. Other specific details can be found in the table(s) above. Prepared and Electronically Authenticated by  Leonides Sake 2014-09-28T08:51:27.040   ASSESSMENT AND PLAN  Assessment/Plan   67 yo woman with h/o CAD, s/p PCI x 3 in years 1998, 1998, not followed by cardiology, h/o HTN, and recent ? Diagnosis of DVT not anticoagulated who presented with chest pressure over the last 1-2 months now with associated fatigue.  1. NSTEMI - the patient is on Heparin drip, she is now asymptomatic and has no acute ECG changes. She has prior interventions 16 years ago.Her ARI has resolved and we will schedule her for a cath tomorrow. TTE today showed basal and mid inferior and inferoseptal hypokinesis, overall EF preserved, impaired  relaxation  - continue iv heparin drip - continue ASA 81 mg, hight dose atorvastatin, BB, hold ACEI (ARI) until after cath  2. 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 Hypertension - controlled - ACEI held - Amlodipine 5 mg QD added on 09/21/13  4. ? H/O DVT -  [place] is negative, CT PE negative   5. Hypokalemia - replace KCl  6. Anxiety - we will add Ativan 0.5 mg PRN  Signed, Tobias Alexander, H MD 09/22/2013

## 2013-09-22 NOTE — Progress Notes (Signed)
09/22/13  Pharmacy-  Heparin 1320  Heparin level 0.37  A/P:  67yo female with NSTEMI.  Repeat heparin level remain therapeutic on current rate.  No bleeding problems noted.  Will continue current rate and f/u in AM.   Marisue Humble, PharmD Clinical Pharmacist [facility]

## 2013-09-22 NOTE — Progress Notes (Signed)
ANTICOAGULATION CONSULT NOTE - Follow Up Consult  Pharmacy Consult for Heparin Indication: chest pain/ACS  No Known Allergies  Patient Measurements: Height: 5' 0.5" (153.7 cm) Weight: 126 lb 1.6 oz (57.199 kg) IBW/kg (Calculated) : 46.65 Heparin Dosing Weight:   Vital Signs: Temp: 98.9 F (37.2 C) (09/28 1029) Temp src: Oral (09/28 1029) BP: 158/96 mmHg (09/28 1029) Pulse Rate: 79 (09/28 1029)  Labs:  Recent Labs  09/20/13 1936  09/20/13 2127 09/21/13 0215 09/21/13 0602 09/21/13 0855 09/21/13 1430 09/22/13 0615  HGB 14.3  --   --   --  13.4  --   --  12.4  HCT 40.0  --   --   --  37.4  --   --  36.2  PLT 181  --   --   --  140*  --   --  127*  APTT  --   --   --   --  51*  --   --   --   LABPROT  --   --   --   --  13.6  --   --   --   INR  --   --   --   --  1.06  --   --   --   HEPARINUNFRC  --   --   --   --  0.21*  --  0.21* 0.36  CREATININE 0.99  --   --   --  2.17*  --   --  0.53  TROPONINI  --   < > 3.19* 3.15*  --  3.18*  --   --   < > = values in this interval not displayed.  Estimated Creatinine Clearance: 54.8 ml/min (by C-G formula based on Cr of 0.53).   Medications:  Scheduled:  . amLODipine  5 mg Oral Daily  . aspirin EC  81 mg Oral Daily  . atorvastatin  80 mg Oral q1800  . metoprolol  100 mg Oral BID  . pantoprazole  40 mg Oral Daily  . potassium chloride  40 mEq Oral Once    Assessment: 67yo female with NSTEMI.  Heparin level therapeutic this AM at 0.36, Hg is wnl, and pltc down to 127.  No bleeding problems noted.  Cr corrected, for cath Monday.  Goal of Therapy:  Heparin level 0.3-0.7 units/ml Monitor platelets by anticoagulation protocol: Yes   Plan:  1.  Continue Heparin at current rate 2.  Repeat HL 6hr to verify therapeutic 3.  Watch pltc closely  Marisue Humble, PharmD Clinical Pharmacist [facility]

## 2013-09-23 ENCOUNTER — Inpatient Hospital Stay (HOSPITAL_COMMUNITY): Payer: Medicaid - Out of State

## 2013-09-23 ENCOUNTER — Encounter (HOSPITAL_COMMUNITY)
Admission: EM | Disposition: A | Discharge status: Home / Self Care | Payer: Self-pay | Source: Home / Self Care | Attending: Internal Medicine

## 2013-09-23 DIAGNOSIS — I214 Non-ST elevation (NSTEMI) myocardial infarction: Secondary | ICD-10-CM

## 2013-09-23 DIAGNOSIS — I2510 Atherosclerotic heart disease of native coronary artery without angina pectoris: Secondary | ICD-10-CM

## 2013-09-23 HISTORY — PX: PERCUTANEOUS CORONARY STENT INTERVENTION (PCI-S): SHX5485

## 2013-09-23 HISTORY — PX: LEFT HEART CATHETERIZATION WITH CORONARY ANGIOGRAM: SHX5451

## 2013-09-23 LAB — CBC
Hemoglobin: 12.8 g/dL (ref 12.0–15.0)
Hemoglobin: 13.1 g/dL (ref 12.0–15.0)
MCH: 32 pg (ref 26.0–34.0)
MCHC: 35 g/dL (ref 30.0–36.0)
Platelets: 131 10*3/uL — ABNORMAL LOW (ref 150–400)
Platelets: 140 10*3/uL — ABNORMAL LOW (ref 150–400)
RBC: 3.84 MIL/uL — ABNORMAL LOW (ref 3.87–5.11)
RBC: 4.1 MIL/uL (ref 3.87–5.11)
RDW: 12.7 % (ref 11.5–15.5)

## 2013-09-23 LAB — BASIC METABOLIC PANEL
CO2: 25 mEq/L (ref 19–32)
Calcium: 9.1 mg/dL (ref 8.4–10.5)
Creatinine, Ser: 0.61 mg/dL (ref 0.50–1.10)
GFR calc Af Amer: >90 mL/min (ref >90)
Glucose, Bld: 128 mg/dL — ABNORMAL HIGH (ref 70–99)
Sodium: 141 mEq/L (ref 135–145)

## 2013-09-23 LAB — GLUCOSE, CAPILLARY: Glucose-Capillary: 139 mg/dL — ABNORMAL HIGH (ref 70–99)

## 2013-09-23 LAB — CREATININE, SERUM
Creatinine, Ser: 0.48 mg/dL — ABNORMAL LOW (ref 0.50–1.10)
GFR calc non Af Amer: >90 mL/min (ref >90)

## 2013-09-23 LAB — HEPARIN LEVEL (UNFRACTIONATED): Heparin Unfractionated: 0.46 IU/mL (ref 0.30–0.70)

## 2013-09-23 SURGERY — LEFT HEART CATHETERIZATION WITH CORONARY ANGIOGRAM
Anesthesia: LOCAL

## 2013-09-23 MED ORDER — FENTANYL CITRATE 0.05 MG/ML IJ SOLN
INTRAMUSCULAR | Status: AC
  Filled 2013-09-23: qty 2

## 2013-09-23 MED ORDER — VERAPAMIL HCL 2.5 MG/ML IV SOLN
INTRAVENOUS | Status: AC
  Filled 2013-09-23: qty 2

## 2013-09-23 MED ORDER — HEPARIN SODIUM (PORCINE) 1000 UNIT/ML IJ SOLN
INTRAMUSCULAR | Status: AC
  Filled 2013-09-23: qty 1

## 2013-09-23 MED ORDER — SODIUM CHLORIDE 0.9 % IV SOLN: 1.0000 mL/kg/h | INTRAVENOUS | Status: AC

## 2013-09-23 MED ORDER — HEPARIN (PORCINE) IN NACL 2-0.9 UNIT/ML-% IJ SOLN
INTRAMUSCULAR | Status: AC
  Filled 2013-09-23: qty 1000

## 2013-09-23 MED ORDER — LORAZEPAM 0.5 MG PO TABS
0.5000 mg | ORAL_TABLET | Freq: Once | ORAL | Status: AC
  Administered 2013-09-23: 0.5 mg via ORAL

## 2013-09-23 MED ORDER — HEPARIN SODIUM (PORCINE) 5000 UNIT/ML IJ SOLN
5000.0000 [IU] | Freq: Three times a day (TID) | INTRAMUSCULAR | Status: DC
  Administered 2013-09-23 – 2013-09-25 (×4): 5000 [IU] via SUBCUTANEOUS
  Filled 2013-09-23 (×8): qty 1

## 2013-09-23 MED ORDER — MIDAZOLAM HCL 2 MG/2ML IJ SOLN
INTRAMUSCULAR | Status: AC
  Filled 2013-09-23: qty 2

## 2013-09-23 MED ORDER — HYDRALAZINE HCL 20 MG/ML IJ SOLN
10.0000 mg | Freq: Once | INTRAMUSCULAR | Status: AC
  Administered 2013-09-23: 10 mg via INTRAVENOUS
  Filled 2013-09-23: qty 1

## 2013-09-23 MED ORDER — TICAGRELOR 90 MG PO TABS
90.0000 mg | ORAL_TABLET | Freq: Two times a day (BID) | ORAL | Status: DC
  Administered 2013-09-23 – 2013-09-25 (×4): 90 mg via ORAL
  Filled 2013-09-23 (×5): qty 1

## 2013-09-23 MED ORDER — NITROGLYCERIN 0.2 MG/ML ON CALL CATH LAB
INTRAVENOUS | Status: AC
  Filled 2013-09-23: qty 1

## 2013-09-23 MED ORDER — LIDOCAINE HCL (PF) 1 % IJ SOLN
INTRAMUSCULAR | Status: AC
  Filled 2013-09-23: qty 30

## 2013-09-23 MED ORDER — BIVALIRUDIN 250 MG IV SOLR
INTRAVENOUS | Status: AC
  Filled 2013-09-23: qty 250

## 2013-09-23 MED ORDER — TICAGRELOR 90 MG PO TABS
ORAL_TABLET | ORAL | Status: AC
  Administered 2013-09-24: 90 mg via ORAL
  Filled 2013-09-23: qty 2

## 2013-09-23 NOTE — Progress Notes (Signed)
Dr. Swaziland notified of pt complaint of seeing lightning bolts, headache and shortness of breath.  Will continue to monitor pt closely.

## 2013-09-23 NOTE — Progress Notes (Signed)
Pt states she has been throwing up.  Nurse noted that pt spitting up mucous, not vomiting.  Pt appears to be anxious, stating she "might as well just die".  Emotional support given to pt. Will continue to monitor pt closely.

## 2013-09-23 NOTE — H&P (View-Only) (Signed)
Patient Name: Savannah Carlson Date of Encounter: 09/23/2013  Active Problems: NSTEMI  SUBJECTIVE  The patient complains of anxiety and some left sided chest soreness.  CURRENT MEDS . amLODipine  5 mg Oral Daily  . aspirin EC  81 mg Oral Daily  . atorvastatin  80 mg Oral q1800  . metoprolol  100 mg Oral BID  . pantoprazole  40 mg Oral Daily  . sodium chloride  3 mL Intravenous Q12H    OBJECTIVE  Filed Vitals:   09/22/13 1303 09/22/13 1500 09/22/13 2133 09/23/13 0505  BP: 141/57 145/75 140/65 135/52  Pulse: 64 60 57 63  Temp: 98.3 F (36.8 C) 97.8 F (36.6 C) 98.3 F (36.8 C) 98 F (36.7 C)  TempSrc: Oral Oral Oral Oral  Resp: 18  18 18   Height:      Weight:    128 lb 1.4 oz (58.1 kg)  SpO2: 96% 95% 96% 96%    Intake/Output Summary (Last 24 hours) at 09/23/13 0740 Last data filed at 09/22/13 1700  Gross per 24 hour  Intake    640 ml  Output      0 ml  Net    640 ml   Filed Weights   09/21/13 0449 09/22/13 0440 09/23/13 0505  Weight: 127 lb 3.6 oz (57.71 kg) 126 lb 1.6 oz (57.199 kg) 128 lb 1.4 oz (58.1 kg)    PHYSICAL EXAM  General: Pleasant, NAD. Neuro: Alert and oriented X 3. Moves all extremities spontaneously. Psych: Normal affect. HEENT:  Normal  Neck: Supple without bruits or JVD. Lungs:  Resp regular and unlabored, CTA. Tenderness in the lower posterior portion of the right chest wall Heart: RRR no s3, s4, or murmurs. Abdomen: Soft, non-tender, non-distended, BS + x 4.  Extremities: No clubbing, cyanosis or edema. DP/PT/Radials 2+ and equal bilaterally.  Accessory Clinical Findings  CBC  Recent Labs  09/20/13 1936  09/22/13 0615 09/23/13 0440  WBC 7.8  < > 4.6 5.3  NEUTROABS 5.1  --   --   --   HGB 14.3  < > 12.4 12.8  HCT 40.0  < > 36.2 36.6  MCV 94.6  < > 95.5 95.3  PLT 181  < > 127* 131*  < > = values in this interval not displayed. Basic Metabolic Panel  Recent Labs  09/22/13 0615 09/23/13 0440  NA 141 141  K 3.4* 3.6  CL 107  104  CO2 25 25  GLUCOSE 120* 128*  BUN 9 12  CREATININE 0.53 0.61  CALCIUM 8.7 9.1   Liver Function Tests  Recent Labs  09/20/13 1936  AST 13  ALT 8  ALKPHOS 98  BILITOT 0.4  PROT 7.2  ALBUMIN 3.8   No results found for this basename: LIPASE, AMYLASE,  in the last 72 hours Cardiac Enzymes  Recent Labs  09/20/13 2127 09/21/13 0215 09/21/13 0855  TROPONINI 3.19* 3.15* 3.18*    Recent Labs  09/21/13 0602  CHOL 170  HDL 29*  LDLCALC 87  TRIG 409*  CHOLHDL 5.9   TELE SR, HR 70', frequent PVCs  ECG  SR, 74 BPM, LVH, QTc 500  Radiology/Studies  Dg Chest 2 View  09/20/2013   IMPRESSION: Mild cardiomegaly. Scattered areas of linear atelectasis and or scarring in both lungs. COPD/emphysema. No acute cardiopulmonary disease otherwise.   Electronically Signed   By: Hulan Saas   On: 09/20/2013 20:32   Ct Angio Chest W/cm &/or Wo Cm  09/20/2013 IMPRESSION: No evidence of  pulmonary emboli.  Cardiomegaly, coronary artery disease and emphysema.  Fractures of the anterolateral right fourth and fifth ribs - age indeterminate.  Correlate with pain.   Original Report Authenticated By: Harmon Pier, M.D.   Duplex US B/L LE veins 09/21/2013 - No evidence of deep vein or superficial thrombosis involving the right lower extremity and left lower extremity. - No evidence of Baker's cyst on the right or left. Other specific details can be found in the table(s) above. Prepared and Electronically Authenticated by  Leonides Sake 2014-09-28T08:51:27.040   ASSESSMENT AND PLAN  Assessment/Plan   67 yo woman with h/o CAD, s/p PCI x 3 in years 1998, 1999, another cath in 2009 with no intervention, not followed by cardiology, h/o HTN, who presented with chest pressure with associated fatigue.  1. NSTEMI - the patient is on Heparin drip, she is now asymptomatic and has no acute ECG changes. She has prior interventions. Her ARI has resolved and she is scheduled her for a cath today.  TTE showed basal and mid inferior and inferoseptal hypokinesis, overall EF preserved, impaired relaxation  - continue iv heparin drip - continue ASA 81 mg, hight dose atorvastatin, BB, hold ACEI (ARI) until after cath  2. 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 Hypertension - controlled - ACEI held - Amlodipine 5 mg QD added on 09/21/13  4. ? H/O DVT -  [place] is negative, CT PE negative   5. Anxiety - Ativan 0.5 mg PRN  Signed, Tobias Alexander, H MD 09/23/2013

## 2013-09-23 NOTE — Interval H&P Note (Signed)
History and Physical Interval Note:  09/23/2013 1:31 PM  Savannah Carlson  has presented today for surgery, with the diagnosis of cp  The various methods of treatment have been discussed with the patient and family. After consideration of risks, benefits and other options for treatment, the patient has consented to  Procedure(s): LEFT HEART CATHETERIZATION WITH CORONARY ANGIOGRAM (N/A) as a surgical intervention .  The patient's history has been reviewed, patient examined, no change in status, stable for surgery.  I have reviewed the patient's chart and labs.  Questions were answered to the patient's satisfaction.   Cath Lab Visit (complete for each Cath Lab visit)  Clinical Evaluation Leading to the Procedure:   ACS: yes  Non-ACS:    Anginal Classification: CCS III  Anti-ischemic medical therapy: Minimal Therapy (1 class of medications)  Non-Invasive Test Results: No non-invasive testing performed  Prior CABG: No previous CABG        Theron Arista [facility] 09/23/2013 1:31 PM

## 2013-09-23 NOTE — Progress Notes (Signed)
Called by a nurse to see the patient. The patient complained of headache-diffuse. She noted lightening bolts in her vision. She complains of shortness of breath. She has no chest pain. She has no visual field cut or loss of vision. On exam patient is hypertensive. Pulse is 65 in sinus rhythm. Lungs are clear. Cardiac exam is without gallop or murmur. Patient is alert and oriented x3. Radial nerves II through XII are intact. She is diffusely weak but has no focal neurologic deficit.  We'll administer Apresoline 10 mg IV for blood pressure. We'll obtain a stat portable chest x-ray. ECG post procedure was without significant ST changes. She has no clinical chest pain to suggest stent thrombosis. We'll monitor her neurologic status closely for any change.  Sholom Dulude [place] MD, [facility] 09/23/2013 4:41 PM

## 2013-09-23 NOTE — Progress Notes (Addendum)
Patient Name: Savannah Carlson Date of Encounter: 09/23/2013  Active Problems: NSTEMI  SUBJECTIVE  The patient complains of anxiety and some left sided chest soreness.  CURRENT MEDS . amLODipine  5 mg Oral Daily  . aspirin EC  81 mg Oral Daily  . atorvastatin  80 mg Oral q1800  . metoprolol  100 mg Oral BID  . pantoprazole  40 mg Oral Daily  . sodium chloride  3 mL Intravenous Q12H    OBJECTIVE  Filed Vitals:   09/22/13 1303 09/22/13 1500 09/22/13 2133 09/23/13 0505  BP: 141/57 145/75 140/65 135/52  Pulse: 64 60 57 63  Temp: 98.3 F (36.8 C) 97.8 F (36.6 C) 98.3 F (36.8 C) 98 F (36.7 C)  TempSrc: Oral Oral Oral Oral  Resp: 18  18 18  Height:      Weight:    128 lb 1.4 oz (58.1 kg)  SpO2: 96% 95% 96% 96%    Intake/Output Summary (Last 24 hours) at 09/23/13 0740 Last data filed at 09/22/13 1700  Gross per 24 hour  Intake    640 ml  Output      0 ml  Net    640 ml   Filed Weights   09/21/13 0449 09/22/13 0440 09/23/13 0505  Weight: 127 lb 3.6 oz (57.71 kg) 126 lb 1.6 oz (57.199 kg) 128 lb 1.4 oz (58.1 kg)    PHYSICAL EXAM  General: Pleasant, NAD. Neuro: Alert and oriented X 3. Moves all extremities spontaneously. Psych: Normal affect. HEENT:  Normal  Neck: Supple without bruits or JVD. Lungs:  Resp regular and unlabored, CTA. Tenderness in the lower posterior portion of the right chest wall Heart: RRR no s3, s4, or murmurs. Abdomen: Soft, non-tender, non-distended, BS + x 4.  Extremities: No clubbing, cyanosis or edema. DP/PT/Radials 2+ and equal bilaterally.  Accessory Clinical Findings  CBC  Recent Labs  09/20/13 1936  09/22/13 0615 09/23/13 0440  WBC 7.8  < > 4.6 5.3  NEUTROABS 5.1  --   --   --   HGB 14.3  < > 12.4 12.8  HCT 40.0  < > 36.2 36.6  MCV 94.6  < > 95.5 95.3  PLT 181  < > 127* 131*  < > = values in this interval not displayed. Basic Metabolic Panel  Recent Labs  09/22/13 0615 09/23/13 0440  NA 141 141  K 3.4* 3.6  CL 107  104  CO2 25 25  GLUCOSE 120* 128*  BUN 9 12  CREATININE 0.53 0.61  CALCIUM 8.7 9.1   Liver Function Tests  Recent Labs  09/20/13 1936  AST 13  ALT 8  ALKPHOS 98  BILITOT 0.4  PROT 7.2  ALBUMIN 3.8   No results found for this basename: LIPASE, AMYLASE,  in the last 72 hours Cardiac Enzymes  Recent Labs  09/20/13 2127 09/21/13 0215 09/21/13 0855  TROPONINI 3.19* 3.15* 3.18*    Recent Labs  09/21/13 0602  CHOL 170  HDL 29*  LDLCALC 87  TRIG 272*  CHOLHDL 5.9   TELE SR, HR 70', frequent PVCs  ECG  SR, 74 BPM, LVH, QTc 500  Radiology/Studies  Dg Chest 2 View  09/20/2013   IMPRESSION: Mild cardiomegaly. Scattered areas of linear atelectasis and or scarring in both lungs. COPD/emphysema. No acute cardiopulmonary disease otherwise.   Electronically Signed   By: Thomas  Lawrence   On: 09/20/2013 20:32   Ct Angio Chest W/cm &/or Wo Cm  09/20/2013 IMPRESSION: No evidence of   pulmonary emboli.  Cardiomegaly, coronary artery disease and emphysema.  Fractures of the anterolateral right fourth and fifth ribs - age indeterminate.  Correlate with pain.   Original Report Authenticated By: Jeffrey Hu, M.D.   Duplex US B/L LE veins 09/21/2013 - No evidence of deep vein or superficial thrombosis involving the right lower extremity and left lower extremity. - No evidence of Baker's cyst on the right or left. Other specific details can be found in the table(s) above. Prepared and Electronically Authenticated by  Chen, Brian 2014-09-28T08:51:27.040   ASSESSMENT AND PLAN  Assessment/Plan   67 yo woman with h/o CAD, s/p PCI x 3 in years 1998, 1999, another cath in 2009 with no intervention, not followed by cardiology, h/o HTN, who presented with chest pressure with associated fatigue.  1. NSTEMI - the patient is on Heparin drip, she is now asymptomatic and has no acute ECG changes. She has prior interventions. Her ARI has resolved and she is scheduled her for a cath today.  TTE showed basal and mid inferior and inferoseptal hypokinesis, overall EF preserved, impaired relaxation  - continue iv heparin drip - continue ASA 81 mg, hight dose atorvastatin, BB, hold ACEI (ARI) until after cath  2. 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 Hypertension - controlled - ACEI held - Amlodipine 5 mg QD added on 09/21/13  4. ? H/O DVT -  US is negative, CT PE negative   5. Anxiety - Ativan 0.5 mg PRN  Signed, Arali Somera, H MD 09/23/2013   

## 2013-09-23 NOTE — CV Procedure (Signed)
   Cardiac Catheterization Procedure Note  Name: Savannah Carlson MRN: [id number] DOB: 1946-06-08  Procedure: Left Heart Cath, Selective Coronary Angiography, PTCA and stenting of the LCx/OM1  Indication: 67 yo WF with history of CAD s/p stenting of the proximal RCA and proximal LCx in the past. She presents with a NSTEMI. Recent class III angina.  Procedural Details:  The right wrist was prepped, draped, and anesthetized with 1% lidocaine. Using the modified Seldinger technique, a 6 French sheath was introduced into the right radial artery. 3 mg of verapamil was administered through the sheath, weight-based unfractionated heparin was administered intravenously. Standard Judkins catheters were used for selective coronary angiography and left ventriculography. Catheter exchanges were performed over an exchange length guidewire.  PROCEDURAL FINDINGS Hemodynamics: AO 149/62 mean 95 mm Hg LV 145/12 mm Hg   Coronary angiography: Coronary dominance: right  Left mainstem: Moderate calcification with 20% distal disease.  Left anterior descending (LAD): Moderate calcification with 30-40% disease in the mid LAD.   Left circumflex (LCx): Stent in the proximal vessel is widely patent. There is a 90% stenosis in the mid LCx at the bifurcation of the OM1 and OM2 vessels.  Right coronary artery (RCA): There is a stent in the proximal to mid RCA. There is a focal 50-70% stenosis in the proximal stent. The remainder of the stent has diffuse 30% disease. The rest of the RCA is without significant disease.  Left ventriculography: Left ventricular systolic function is normal, LVEF is estimated at 55-65%, there is no significant mitral regurgitation   PCI Note:  Following the diagnostic procedure, the decision was made to proceed with PCI. Brilinta 180 mg was given orally. Weight-based bivalirudin was given for anticoagulation. Once a therapeutic ACT was achieved, a 6 [place] XBLAD 3.5 guide catheter was  inserted.  A prowater coronary guidewire was used to cross the lesion in the OM1. A second prowater was placed in the OM2.  The OM2 ostium lesion was predilated with a 2.5 mm balloon.  We were unable to cross this lesion with a cutting balloon. We were able to dilate the ostium of OM2 with a 2.5 mm Angiosculpt balloon.The lesion In OM1 was then stented with a 2.5 x 16 mm Promus premier stent covering the origin of OM2.  The stent was postdilated with a 2.75 mm noncompliant balloon in the more proximal portion of the stent.  Following PCI, there was 0% residual stenosis and TIMI-3 flow. There was less than 30% stenosis at the origin of OM2. Final angiography confirmed an excellent result. The patient tolerated the procedure well. There were no immediate procedural complications. A TR band was used for radial hemostasis. The patient was transferred to the post catheterization recovery area for further monitoring.  PCI Data: Vessel - LCx/Segment - Bifurcation of OM1/OM2 Percent Stenosis (pre)  90% TIMI-flow 3 Stent 2.5 x 16 mm Promus premier Percent Stenosis (post) 0% TIMI-flow (post) 3  Final Conclusions:   1. Single vessel obstructive CAD. 2. Successful PCI with stenting of OM1 with a DES and PTCA of the ostium of OM2.  Recommendations: Dual antiplatelet therapy for one year.    Theron Arista [facility] 09/23/2013, 2:31 PM

## 2013-09-23 NOTE — Progress Notes (Signed)
ANTICOAGULATION CONSULT NOTE - Follow Up Consult  Pharmacy Consult for Heparin Indication: chest pain/ACS  No Known Allergies  Patient Measurements: Height: 5' 0.5" (153.7 cm) Weight: 128 lb 1.4 oz (58.1 kg) IBW/kg (Calculated) : 46.65 Heparin Dosing Weight: 58.1kg  Vital Signs: Temp: 98 F (36.7 C) (09/29 0505) Temp src: Oral (09/29 0505) BP: 135/52 mmHg (09/29 0505) Pulse Rate: 63 (09/29 0505)  Labs:  Recent Labs  09/20/13 1936  09/20/13 2127 09/21/13 0215 09/21/13 0602 09/21/13 0855  09/22/13 0615 09/22/13 1230 09/23/13 0440  HGB 14.3  --   --   --  13.4  --   --  12.4  --  12.8  HCT 40.0  --   --   --  37.4  --   --  36.2  --  36.6  PLT 181  --   --   --  140*  --   --  127*  --  131*  APTT  --   --   --   --  51*  --   --   --   --   --   LABPROT  --   --   --   --  13.6  --   --   --   --   --   INR  --   --   --   --  1.06  --   --   --   --   --   HEPARINUNFRC  --   --   --   --  0.21*  --   < > 0.36 0.37 0.46  CREATININE 0.99  --   --   --  2.17*  --   --  0.53  --  0.61  TROPONINI  --   < > 3.19* 3.15*  --  3.18*  --   --   --   --   < > = values in this interval not displayed.  Estimated Creatinine Clearance: 55.3 ml/min (by C-G formula based on Cr of 0.61).   Medications:  Heparin @ 950 units/hr  Assessment: 67yof continues on heparin for NSTEMI with plans for cath today. Heparin level is therapeutic. CBC is stable. No bleeding reported.  Goal of Therapy:  Heparin level 0.3-0.7 units/ml Monitor platelets by anticoagulation protocol: Yes   Plan:  1) Continue heparin at 950 units/hr 2) Follow up after cath  Fredrik Rigger 09/23/2013,10:15 AM

## 2013-09-23 NOTE — Progress Notes (Signed)
Dr. [place] notified that chest xray is completed.  Pt still with complaints of headache and lightningbolts in vision.  Will continue to monitor pt closely.

## 2013-09-24 LAB — CBC
Hemoglobin: 13.5 g/dL (ref 12.0–15.0)
MCH: 33.6 pg (ref 26.0–34.0)
MCHC: 36.1 g/dL — ABNORMAL HIGH (ref 30.0–36.0)
Platelets: 169 10*3/uL (ref 150–400)
RBC: 4.02 MIL/uL (ref 3.87–5.11)
WBC: 8.5 10*3/uL (ref 4.0–10.5)

## 2013-09-24 LAB — BASIC METABOLIC PANEL
BUN: 10 mg/dL (ref 6–23)
Calcium: 9.2 mg/dL (ref 8.4–10.5)
Creatinine, Ser: 0.5 mg/dL (ref 0.50–1.10)
GFR calc Af Amer: >90 mL/min (ref >90)
GFR calc non Af Amer: >90 mL/min (ref >90)
Glucose, Bld: 141 mg/dL — ABNORMAL HIGH (ref 70–99)
Potassium: 3.1 mEq/L — ABNORMAL LOW (ref 3.5–5.1)
Sodium: 137 mEq/L (ref 135–145)

## 2013-09-24 LAB — MRSA PCR SCREENING: MRSA by PCR: NEGATIVE

## 2013-09-24 MED ORDER — POTASSIUM CHLORIDE 20 MEQ/15ML (10%) PO LIQD
40.0000 meq | Freq: Two times a day (BID) | ORAL | Status: DC
  Administered 2013-09-24: 40 meq via ORAL
  Filled 2013-09-24 (×2): qty 30

## 2013-09-24 MED ORDER — POTASSIUM CHLORIDE 20 MEQ/15ML (10%) PO LIQD
40.0000 meq | Freq: Two times a day (BID) | ORAL | Status: DC
  Administered 2013-09-25: 40 meq via ORAL
  Filled 2013-09-24 (×3): qty 30

## 2013-09-24 MED ORDER — POTASSIUM CHLORIDE CRYS ER 20 MEQ PO TBCR
40.0000 meq | EXTENDED_RELEASE_TABLET | Freq: Two times a day (BID) | ORAL | Status: DC
  Administered 2013-09-24: 40 meq via ORAL
  Filled 2013-09-24 (×3): qty 2

## 2013-09-24 MED ORDER — LISINOPRIL 5 MG PO TABS
5.0000 mg | ORAL_TABLET | Freq: Every day | ORAL | Status: DC
  Administered 2013-09-24 – 2013-09-25 (×2): 5 mg via ORAL
  Filled 2013-09-24 (×2): qty 1

## 2013-09-24 MED FILL — Sodium Chloride IV Soln 0.9%: INTRAVENOUS | Qty: 50 | Status: AC

## 2013-09-24 NOTE — Progress Notes (Addendum)
Patient Name: Savannah Carlson Date of Encounter: 09/24/2013  Active Problems: NSTEMI  SUBJECTIVE  The patient complains of anxiety and headache, no chest pain.  CURRENT MEDS . amLODipine  5 mg Oral Daily  . aspirin EC  81 mg Oral Daily  . atorvastatin  80 mg Oral q1800  . heparin  5,000 Units Subcutaneous Q8H  . metoprolol  100 mg Oral BID  . pantoprazole  40 mg Oral Daily  . Ticagrelor  90 mg Oral BID    OBJECTIVE  Filed Vitals:   09/24/13 0500 09/24/13 0600 09/24/13 0700 09/24/13 0734  BP:    131/50  Pulse: 72 81 65 77  Temp:    98.3 F (36.8 C)  TempSrc:    Oral  Resp: 17 16 17 19   Height:      Weight: 124 lb 1.9 oz (56.3 kg)     SpO2: 93% 95% 92% 95%    Intake/Output Summary (Last 24 hours) at 09/24/13 0902 Last data filed at 09/24/13 0600  Gross per 24 hour  Intake 875.75 ml  Output    875 ml  Net   0.75 ml   Filed Weights   09/23/13 0505 09/23/13 1530 09/24/13 0500  Weight: 128 lb 1.4 oz (58.1 kg) 128 lb 15.5 oz (58.5 kg) 124 lb 1.9 oz (56.3 kg)    PHYSICAL EXAM  General: Pleasant, NAD. Neuro: Alert and oriented X 3. Moves all extremities spontaneously. Psych: Normal affect. HEENT:  Normal  Neck: Supple without bruits or JVD. Lungs:  Resp regular and unlabored, CTA. Tenderness in the lower posterior portion of the right chest wall Heart: RRR no s3, s4, or murmurs. Abdomen: Soft, non-tender, non-distended, BS + x 4.  Extremities: No clubbing, cyanosis or edema. DP/PT/Radials 2+ and equal bilaterally.  Accessory Clinical Findings  CBC  Recent Labs  09/23/13 1735 09/24/13 0510  WBC 9.7 8.5  HGB 13.1 13.5  HCT 38.6 37.4  MCV 94.1 93.0  PLT 140* 169   Basic Metabolic Panel  Recent Labs  09/23/13 0440 09/23/13 1735 09/24/13 0510  NA 141  --  137  K 3.6  --  3.1*  CL 104  --  100  CO2 25  --  21  GLUCOSE 128*  --  141*  BUN 12  --  10  CREATININE 0.61 0.48* 0.50  CALCIUM 9.1  --  9.2    TELE SR, HR 70', some PVCs  ECG  SR, 74  BPM, LVH, QTc 500  Radiology/Studies  Dg Chest 2 View  09/20/2013   IMPRESSION: Mild cardiomegaly. Scattered areas of linear atelectasis and or scarring in both lungs. COPD/emphysema. No acute cardiopulmonary disease otherwise.   Electronically Signed   By: Hulan Saas   On: 09/20/2013 20:32   Ct Angio Chest W/cm &/or Wo Cm  09/20/2013 IMPRESSION: No evidence of pulmonary emboli.  Cardiomegaly, coronary artery disease and emphysema.  Fractures of the anterolateral right fourth and fifth ribs - age indeterminate.  Correlate with pain.   Original Report Authenticated By: Harmon Pier, M.D.   Duplex US B/L LE veins 09/21/2013 - No evidence of deep vein or superficial thrombosis involving the right lower extremity and left lower extremity. - No evidence of Baker's cyst on the right or left. Other specific details can be found in the table(s) above. Prepared and Electronically Authenticated by   ASSESSMENT AND PLAN  67 yo woman with h/o CAD, s/p PCI x 3 in years 1998, 1999, another cath in 2009 with  no intervention, not followed by cardiology, h/o HTN, who presented with chest pressure with associated fatigue. - continue ASA 81 mg, Ticagrelor, hight dose atorvastatin, BB, hold ACEI (ARI) until after cath  2. 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 Hypertension -  - Add Lisinopril 5 mg daily, follow Crea/GFR - Amlodipine 5 mg QD added on 09/21/13  4. Hypokalemia - replace with KCl  5. ? H/O DVT -  [place] is negative, CT PE negative   6. Anxiety - Ativan 0.5 mg PRN  Plan: D/C tomorrow, mobilize today  Signed, Tobias Alexander, H MD 09/24/2013

## 2013-09-24 NOTE — Care Management Note (Signed)
    Page 1 of 1   09/24/2013     10:23:12 AM   CARE MANAGEMENT NOTE 09/24/2013  Patient:  Savannah Carlson, Savannah Carlson   Account Number:  [id number]  Date Initiated:  09/23/2013  Documentation initiated by:  Junius Creamer  Subjective/Objective Assessment:   adm w mi, stent placed today in card cath     Action/Plan:   lives w fam   Anticipated DC Date:     Anticipated DC Plan:  HOME/SELF CARE      DC Planning Services  CM consult  Medication Assistance      Choice offered to / List presented to:             Status of service:   Medicare Important Message given?   (If response is "NO", the following Medicare IM given date fields will be blank) Date Medicare IM given:   Date Additional Medicare IM given:    Discharge Disposition:  HOME/SELF CARE  Per UR Regulation:  Reviewed for med. necessity/level of care/duration of stay  If discussed at Long Length of Stay Meetings, dates discussed:    Comments:  9/30  1022a debbie Iasia Forcier rn,bsn gave pt brilinta 30day free card. pt has [employer] and states her copays are low.

## 2013-09-24 NOTE — Progress Notes (Signed)
CARDIAC REHAB PHASE I   PRE:  Rate/Rhythm: 70SR  BP:  Supine: 146/62  Sitting:   Standing:    SaO2:   MODE:  Ambulation: 350 ft   POST:  Rate/Rhythm: 80 SR  BP:  Supine:   Sitting: 152/49  Standing:    SaO2: 97%RA 1315-1422 Pt walked 350 ft with hand held asst with steady gait. Stopped twice to rest. Pt stated she has felt like she is smoldering in the chest since her cath. Pt on brillinta . Discussed with nurse so that she can further evaluate this feeling with pt. Sats good at 97%RA. Discussed with pt importance of brilinta with stent. Pt states if she starts bleeding from female parts she is going to stop taking. Told pt she cannot just stop this med because she could have a heart attack and die. She stated she could die from bleeding. Re enforced with pt that she has to talk with cardiologist if she has any bleeding. Education completed. Pt does not have transportation for CRP 2. Pt states she has been lying around a lot and has not cared about much since husband's death. Pt states she knows she needs to start walking and taking better care of self. Emotional support given . Pt seems depressed at times during our talk.   Luetta Nutting, RN BSN  09/24/2013 2:16 PM

## 2013-09-25 ENCOUNTER — Encounter (HOSPITAL_COMMUNITY): Payer: Self-pay | Admitting: Nurse Practitioner

## 2013-09-25 ENCOUNTER — Telehealth: Payer: Self-pay | Admitting: Cardiology

## 2013-09-25 DIAGNOSIS — I214 Non-ST elevation (NSTEMI) myocardial infarction: Secondary | ICD-10-CM

## 2013-09-25 DIAGNOSIS — I2510 Atherosclerotic heart disease of native coronary artery without angina pectoris: Secondary | ICD-10-CM

## 2013-09-25 DIAGNOSIS — E785 Hyperlipidemia, unspecified: Secondary | ICD-10-CM

## 2013-09-25 DIAGNOSIS — J449 Chronic obstructive pulmonary disease, unspecified: Secondary | ICD-10-CM

## 2013-09-25 LAB — BASIC METABOLIC PANEL
BUN: 10 mg/dL (ref 6–23)
CO2: 21 mEq/L (ref 19–32)
Calcium: 9.4 mg/dL (ref 8.4–10.5)
Chloride: 104 mEq/L (ref 96–112)
Creatinine, Ser: 0.63 mg/dL (ref 0.50–1.10)
GFR calc Af Amer: >90 mL/min (ref >90)
GFR calc non Af Amer: >90 mL/min (ref >90)

## 2013-09-25 MED ORDER — NITROGLYCERIN 0.4 MG SL SUBL: 0.4000 mg | SUBLINGUAL_TABLET | SUBLINGUAL | Status: AC | PRN

## 2013-09-25 MED ORDER — AMLODIPINE BESYLATE 5 MG PO TABS: 5.0000 mg | ORAL_TABLET | Freq: Every day | ORAL | Status: DC

## 2013-09-25 MED ORDER — ASPIRIN 81 MG PO TABS: 81.0000 mg | ORAL_TABLET | Freq: Every day | ORAL | Status: AC

## 2013-09-25 MED ORDER — POTASSIUM CHLORIDE CRYS ER 10 MEQ PO TBCR: 10.0000 meq | EXTENDED_RELEASE_TABLET | Freq: Every day | ORAL | Status: DC

## 2013-09-25 MED ORDER — TICAGRELOR 90 MG PO TABS: 90.0000 mg | ORAL_TABLET | Freq: Two times a day (BID) | ORAL | Status: DC

## 2013-09-25 MED ORDER — ATORVASTATIN CALCIUM 80 MG PO TABS: 80.0000 mg | ORAL_TABLET | Freq: Every day | ORAL | Status: DC

## 2013-09-25 NOTE — Progress Notes (Signed)
CARDIAC REHAB PHASE I   PRE:  Rate/Rhythm: 75 SR with PACs    BP: sitting 125/54    SaO2: 94 RA  MODE:  Ambulation: 350 ft   POST:  Rate/Rhythm: 95 with PACs    BP: sitting 127/54    SaO2: 95 RA  Pt sts she has been having episodic SOB. Resolves after about 1 min. Felt fairly well walking, just tires easily. Pt is depressed and has anxiety. Highly encouraged regular ex for her depression. Monitored SaO2 entire walk and was stable. Has regular PACs. [id number]  Savannah Carlson CES, ACSM 09/25/2013 9:02 AM

## 2013-09-25 NOTE — Discharge Summary (Signed)
Patient ID: Savannah Carlson,  MRN: [id number], DOB/AGE: 1946/04/20 67 y.o.  Admit date: 09/20/2013 Discharge date: 09/25/2013  Primary Care Provider:  R. Reinaldo Raddle, MD - [place], [place] Primary Cardiologist: Ival Bible, MD   Discharge Diagnoses Principal Problem:   NSTEMI (non-ST elevated myocardial infarction) Active Problems:   CAD (coronary artery disease)   Acute on chronic renal insufficiency   Hyperlipidemia   COPD (chronic obstructive pulmonary disease)   Essential hypertension  Allergies No Known Allergies  Procedures  CT Angio of the Chest with Contrast 9.26.2014  IMPRESSION: No evidence of pulmonary emboli. Cardiomegaly, coronary artery disease and emphysema. Fractures of the anterolateral right fourth and fifth ribs - age indeterminate. Correlate with pain. _____________   Bilateral Lower Extremity Duplex 9.27.2014  Summary:  - No evidence of deep vein or superficial thrombosis involving the right lower extremity and left lower extremity. - No evidence of Baker's cyst on the right or left. _____________   2D Echocardiogram 9.27.2014  Study Conclusions  - Left ventricle: The cavity size was normal. There was mild   focal basal hypertrophy of the septum. The estimated   ejection fraction was 55%. There is hypokinesis of the   basalinferior and inferoseptal myocardium. Doppler   parameters are consistent with abnormal left ventricular   relaxation (grade 1 diastolic dysfunction). - Aortic valve: Possibly bicuspid. - Left atrium: The atrium was mildly dilated. _____________  Cardiac Catheterization and Percutaneous Coronary Intervention 9.29.2014  PROCEDURAL FINDINGS Hemodynamics: AO 149/62 mean 95 mm Hg LV 145/12 mm Hg              Coronary angiography: Coronary dominance: right  Left mainstem: Moderate calcification with 20% distal disease. Left anterior descending (LAD): Moderate calcification with 30-40% disease in the mid LAD.   Left circumflex (LCx):  Stent in the proximal vessel is widely patent. There is a 90% stenosis in the mid LCx at the bifurcation of the OM1 and OM2 vessels.    The OM1 was successfully stented using a 2.5 x 16 mm Promus premier DES, while the ostial OM2 was treated with PTCA.  Right coronary artery (RCA): There is a stent in the proximal to mid RCA. There is a focal 50-70% stenosis in the proximal stent. The remainder of the stent has diffuse 30% disease. The rest of the RCA is without significant disease. Left ventriculography: Left ventricular systolic function is normal, LVEF is estimated at 55-65%, there is no significant mitral regurgitation  _____________  History of Present Illness  47 y /o female with a h/o CAD s/p remote stenting of the left circumflex and right coronary artery at [facility].  She does not have regular cardiology follow-up.  She was in her usual state of health until approximately 1-2 wks prior to admission, when she began to experience chest pressure and fatigue occurring at rest along with dyspnea on exertion.  Due to progressive symptoms, she presented to the [facility] ED on 9/26, where ECG was nonacute however initial troponin was elevated @ 3.18.  She reported a recent hospitalization in [place] where she was told that she had a lower extremity blood clot, though she was not placed on anticoagulation at that time.  CT angio of the chest was performed in the ED and showed no evidence of pulmonary embolus.  Pt was evaluated by cardiology and admitted for further evaluation.  Hospital Course  Following admission, troponin remained elevated, however the trend remained flat (3.18->3.19->3.15->3.18).  She was maintained on asa, heparin, beta blocker, and statin therapy  for management of presumed NSTEMI.  Lower extremity ultrasound was performed on 9/27 and was negative for DVT.  2D echocardiogram showed normal LV function with basal inferior and inferoseptal hypokinesis.  Creatinine rose to 2.17 (from 0.99 on  admission) on 9/27.  Her home doses of ace inhibitor and lasix therapy were held and she was hydrated.  Rise in creatinine was felt to be secondary to IV contrast given with CTA of the chest.  Follow-up creatinine on 9/28 was normal at 0.53.  She had no further chest pain and arrangements were made for diagnostic catheterization on 9/29.  This revealed severe disease in the mid left circumflex at the bifurcation of the OM1 and OM2.  The OM1 was successfully treated with drug-eluting stent placement, while the ostial OM2 was treated with PTCA.  She tolerated the procedure well and Brilinta therapy was added to her daily regimen.  Post-cath, she has had no further chest pain.  She reports periodic spells of dyspnea at rest that come on and resolve spontaneously.  She has been ambulating without difficulty and oxygen saturations have been normal.  She has had prior spells prior to the initiation of Brilinta, thus we do not believe that it is the culprit for her dyspnea at this time.  Her renal function has remained stable post PCI and as a result, we have resumed ACEI therapy.  She is felt to be stable for discharge today.  We have arranged for f/u in our [place] office next week.  We have also provided her with a Rx to have a bmet checked at her primary care provider's office in [place], [place] next week.  Discharge Vitals Blood pressure 125/54, pulse 66, temperature 98.5 F (36.9 C), temperature source Oral, resp. rate 16, height 5' 0.5" (1.537 m), weight 121 lb 14.6 oz (55.3 kg), SpO2 94.00%.  Filed Weights   09/23/13 1530 09/24/13 0500 09/25/13 0500  Weight: 128 lb 15.5 oz (58.5 kg) 124 lb 1.9 oz (56.3 kg) 121 lb 14.6 oz (55.3 kg)   Labs  CBC  Recent Labs  09/23/13 1735 09/24/13 0510  WBC 9.7 8.5  HGB 13.1 13.5  HCT 38.6 37.4  MCV 94.1 93.0  PLT 140* 169   Basic Metabolic Panel  Recent Labs  09/24/13 0510 09/25/13 0430  NA 137 137  K 3.1* 4.2  CL 100 104  CO2 21 21  GLUCOSE 141* 109*  BUN  10 10  CREATININE 0.50 0.63  CALCIUM 9.2 9.4   Liver Function Tests Lab Results  Component Value Date   ALT 8 09/20/2013   AST 13 09/20/2013   ALKPHOS 98 09/20/2013   BILITOT 0.4 09/20/2013   Cardiac Enzymes Lab Results  Component Value Date   TROPONINI 3.18* 09/21/2013   Hemoglobin A1C Lab Results  Component Value Date   HGBA1C 5.7* 09/21/2013   Fasting Lipid Panel Lab Results  Component Value Date   CHOL 170 09/21/2013   HDL 29* 09/21/2013   LDLCALC 87 09/21/2013   TRIG 272* 09/21/2013   CHOLHDL 5.9 09/21/2013   Thyroid Function Tests Lab Results  Component Value Date   TSH 1.921 09/21/2013    Disposition  Pt is being discharged home today in good condition.  Follow-up Plans & Appointments      Follow-up Information   Follow up with Nona Dell, MD On 10/02/2013. (11:40 AM)    Specialty:  Cardiology   Contact information:   [address] [place] [place] [address] [phone]  Follow up with Jerene Pitch, MD On 10/01/2013. (for blood chemistry.)    Specialty:  Internal Medicine   Contact information:   [facility] [place] [address] [phone]      Discharge Medications    Medication List    STOP taking these medications       pravastatin 40 MG tablet  Commonly known as:  PRAVACHOL  Replaced by:  atorvastatin 80 MG tablet      TAKE these medications       amLODipine 5 MG tablet  Commonly known as:  NORVASC  Take 1 tablet (5 mg total) by mouth daily.     aspirin 81 MG tablet  Take 1 tablet (81 mg total) by mouth daily.     atorvastatin 80 MG tablet  Commonly known as:  LIPITOR  Take 1 tablet (80 mg total) by mouth daily at 6 PM.     enalapril 10 MG tablet  Commonly known as:  VASOTEC  Take 10 mg by mouth 2 (two) times daily.     furosemide 20 MG tablet  Commonly known as:  LASIX  Take 20 mg by mouth daily.     HYDROcodone-acetaminophen 10-325 MG per tablet  Commonly known as:  NORCO  Take 1 tablet by mouth every 6  (six) hours as needed for pain.     metoprolol 100 MG tablet  Commonly known as:  LOPRESSOR  Take 100 mg by mouth 2 (two) times daily.     nitroGLYCERIN 0.4 MG SL tablet  Commonly known as:  NITROSTAT  Place 1 tablet (0.4 mg total) under the tongue every 5 (five) minutes x 3 doses as needed for chest pain.     omeprazole 20 MG capsule  Commonly known as:  PRILOSEC  Take 20 mg by mouth daily.     potassium chloride 10 MEQ tablet  Commonly known as:  K-DUR,KLOR-CON  Take 1 tablet (10 mEq total) by mouth daily.     temazepam 15 MG capsule  Commonly known as:  RESTORIL  Take 15 mg by mouth at bedtime as needed for sleep.     Ticagrelor 90 MG Tabs tablet  Commonly known as:  BRILINTA  Take 1 tablet (90 mg total) by mouth 2 (two) times daily.      Outstanding Labs/Studies  F/u BMET in 1 wk (Rx provided to obtain @ PCP's office).  Duration of Discharge Encounter   Greater than 30 minutes including physician time.  Signed, Nicolasa Ducking NP 09/25/2013, 9:25 AM

## 2013-09-25 NOTE — Telephone Encounter (Signed)
[facility] dsch cone  10/1

## 2013-09-25 NOTE — Discharge Summary (Signed)
The patient was seen, examined and discussed with Ward Givens, PA-C and I agree with the above.   Tobias Alexander, H 09/25/2013

## 2013-09-25 NOTE — Progress Notes (Signed)
Discharged home by wheelchair, discharge instructions  given to pt. Stable. Belongings with pt.

## 2013-09-25 NOTE — Progress Notes (Signed)
Patient Name: Savannah Carlson Date of Encounter: 09/25/2013   Principal Problem:   NSTEMI (non-ST elevated myocardial infarction) Active Problems:   CAD (coronary artery disease)   Acute on chronic renal insufficiency   Hyperlipidemia   Essential hypertension   SUBJECTIVE  No chest pain overnight.  Still feels intermittently dyspneic, though sats 97% with ambulation yesterday.  She reports chronic doe.  CURRENT MEDS . amLODipine  5 mg Oral Daily  . aspirin EC  81 mg Oral Daily  . atorvastatin  80 mg Oral q1800  . heparin  5,000 Units Subcutaneous Q8H  . lisinopril  5 mg Oral Daily  . metoprolol  100 mg Oral BID  . pantoprazole  40 mg Oral Daily  . potassium chloride  40 mEq Oral BID   Or  . potassium chloride  40 mEq Oral BID  . Ticagrelor  90 mg Oral BID   OBJECTIVE  Filed Vitals:   09/25/13 0500 09/25/13 0600 09/25/13 0700 09/25/13 0810  BP:    125/54  Pulse:    66  Temp:    98.5 F (36.9 C)  TempSrc:    Oral  Resp: 20 22 14 16   Height:      Weight: 121 lb 14.6 oz (55.3 kg)     SpO2:    94%    Intake/Output Summary (Last 24 hours) at 09/25/13 0839 Last data filed at 09/25/13 0600  Gross per 24 hour  Intake    360 ml  Output      0 ml  Net    360 ml   Filed Weights   09/23/13 1530 09/24/13 0500 09/25/13 0500  Weight: 128 lb 15.5 oz (58.5 kg) 124 lb 1.9 oz (56.3 kg) 121 lb 14.6 oz (55.3 kg)    PHYSICAL EXAM  General: Pleasant, NAD. Neuro: Alert and oriented X 3. Moves all extremities spontaneously. Psych: Normal affect. HEENT:  Normal  Neck: Supple without bruits or JVD. Lungs:  Resp regular and unlabored, CTA. Heart: RRR no s3, s4, or murmurs. Abdomen: Soft, non-tender, non-distended, BS + x 4.  Extremities: No clubbing, cyanosis or edema. DP/PT/Radials 2+ and equal bilaterally.  Accessory Clinical Findings  CBC  Recent Labs  09/23/13 1735 09/24/13 0510  WBC 9.7 8.5  HGB 13.1 13.5  HCT 38.6 37.4  MCV 94.1 93.0  PLT 140* 169   Basic  Metabolic Panel  Recent Labs  09/24/13 0510 09/25/13 0430  NA 137 137  K 3.1* 4.2  CL 100 104  CO2 21 21  GLUCOSE 141* 109*  BUN 10 10  CREATININE 0.50 0.63  CALCIUM 9.2 9.4   TELE  rsr  Radiology/Studies  Ct Angio Chest W/cm &/or Wo Cm  09/20/2013   *RADIOLOGY REPORT*  Clinical Data: 67 year old female with chest pain.  CT ANGIOGRAPHY CHEST IMPRESSION: No evidence of pulmonary emboli.  Cardiomegaly, coronary artery disease and emphysema.  Fractures of the anterolateral right fourth and fifth ribs - age indeterminate.  Correlate with pain.   Original Report Authenticated By: Harmon Pier, M.D.   Dg Chest Port 1 View  09/23/2013   CLINICAL DATA:  Shortness of Breath.  EXAM: PORTABLE CHEST - 1 VIEW  COMPARISON:  09/20/2013.  FINDINGS: The cardiac silhouette, mediastinal and hilar contours are normal and stable. Emphysematous changes are again noted. Persistent streaky bibasilar atelectasis. No infiltrates or effusions.  IMPRESSION: Persistent streaky bibasilar atelectasis.   Electronically Signed   By: Loralie Champagne M.D.   On: 09/23/2013 17:22  ASSESSMENT AND PLAN  1.  NSTEMI/CAD:  S/p PCI/DES to OM1/OM2 bifurcation on 9/29.  No further chest pain.  She does have some subj dyspnea, though objectively, O2 sats have been stable.  Cont asa, brilinta, bb, statin.  Pt will f/u in [place] office.  2.  HTN:  Stable on bb/acei/ccb.  3.  HL:  LDL 87. Cont high potency statin.  4.  Dyspnea:  Pt has remote h/o tob abuse and copd.  She is not on inhalers @ home.  Her dyspnea appears to be chronic, thus predating brilinta usage.  O2 sats with ambulation are normal.  CXR 9/29 with some emphysematous changes. Needs outpt primary care f/u of COPD.  Can add prn albuterol.  Signed, Savannah Ducking NP   The patient was seen, examined and discussed with Savannah Givens, NP. Her BP is now normal after initiating Lisinopril 5 mg daily. Crea/GFR is stable. Hypokalemia is corrected. We will add low dose  Lasix 20 mg daily and KCl 10 mEq daily. Follow up at [facility] with labs (lytes, crea/GFR) the next week.  Savannah Carlson, H 09/25/2013

## 2013-09-27 NOTE — Telephone Encounter (Signed)
Patient contacted regarding discharge from [facility] on September 25, 2013.  Patient understands to follow up with provider Dr. Diona Browner on October 02, 2013 at 11:40 am at [facility]. Patient understands her discharge instructions. Patient understands her medications and regimen. Patient understands to bring all of her medications to this visit.   Patient c/o headache and nosebleeds. Also c/o bleeding from left arm area that scab was after cath. Patient also c/o small chest pain this morning, dizziness, and sob. Patient said she didn't have to take nitroglycerin for this pain. Nurse advised DOD and he advised patient to continue all her mediations especially the brilinta and that she may need to have the area in her nose cauterized by ENT. Patient informed and verbalized understanding of plan. Nurse advised patient that MD she will be seeing in office next week, would also be informed.

## 2013-10-02 ENCOUNTER — Encounter: Payer: Self-pay | Admitting: Cardiology

## 2013-10-02 ENCOUNTER — Ambulatory Visit (INDEPENDENT_AMBULATORY_CARE_PROVIDER_SITE_OTHER): Payer: Medicaid - Out of State | Admitting: Cardiology

## 2013-10-02 VITALS — BP 104/62 | HR 64 | Ht 60.5 in | Wt 123.0 lb

## 2013-10-02 DIAGNOSIS — Z79899 Other long term (current) drug therapy: Secondary | ICD-10-CM

## 2013-10-02 DIAGNOSIS — I2510 Atherosclerotic heart disease of native coronary artery without angina pectoris: Secondary | ICD-10-CM

## 2013-10-02 DIAGNOSIS — E785 Hyperlipidemia, unspecified: Secondary | ICD-10-CM

## 2013-10-02 DIAGNOSIS — J449 Chronic obstructive pulmonary disease, unspecified: Secondary | ICD-10-CM

## 2013-10-02 DIAGNOSIS — I10 Essential (primary) hypertension: Secondary | ICD-10-CM

## 2013-10-02 MED ORDER — PRASUGREL HCL 5 MG PO TABS: 5.0000 mg | ORAL_TABLET | Freq: Every day | ORAL | Status: DC

## 2013-10-02 NOTE — Progress Notes (Signed)
Clinical Summary Savannah Carlson is a 67 y.o.female recently discharged from [facility], now presenting for office followup. This is my first meeting with her. She was admitted with chest discomfort and shortness of breath, found to have elevated troponin I consistent with NSTEMI. There had been some concern about the possibility of thromboembolic disease based on outside evaluation in a hospital in [place], however lower extremity Dopplers did not demonstrate DVT, and CT angiography of the chest showed no evidence of pulmonary embolus. She had renal insufficiency, ultimately was able to undergo a cardiac catheterization that revealed severe disease at the bifurcation of OM1 and OM 2, managed as outlined below with DES and PTCA by Dr. Swaziland.  Telephone records reviewed, patient has been reporting some nosebleeds on DAPT. Recent lab work showed hemoglobin 13.5, BUN 10, creatinine 0.5, cholesterol 170, triglycerides 272, HDL 29, LDL 87. She tells me that she has been experiencing intermittent nausea, occasional shortness of breath, headaches. She is very frightened about the possibility of bleeding on her current medications. She states that when she had her coronary interventions at [facility] before, she stopped Plavix after only a few days, stating that it made her feel bad. She states she has had problems with major bleeding in the past, not necessarily related to antiplatelet medications.  Echocardiogram in September revealed mild basal septal hypertrophy with LVEF 55%, hypokinesis of the basal inferior and inferoseptal segment, grade 1 diastolic dysfunction, possible bicuspid aortic valve, mild left atrial enlargement.  Today we discussed her recent cardiac intervention, recommendation to continue DAPT long-term in most cases. Discussed rationale and indications for this. She told that she is very scared about being on Brilinta, and is concerned that she is feeling bad because of this medication.  No  Known Allergies  Current Outpatient Prescriptions  Medication Sig Dispense Refill  . amLODipine (NORVASC) 5 MG tablet Take 1 tablet (5 mg total) by mouth daily.  30 tablet  6  . aspirin 81 MG tablet Take 1 tablet (81 mg total) by mouth daily.      Marland Kitchen atorvastatin (LIPITOR) 80 MG tablet Take 1 tablet (80 mg total) by mouth daily at 6 PM.  30 tablet  6  . enalapril (VASOTEC) 10 MG tablet Take 10 mg by mouth 2 (two) times daily.      . furosemide (LASIX) 20 MG tablet Take 20 mg by mouth daily.      Marland Kitchen HYDROcodone-acetaminophen (NORCO) 10-325 MG per tablet Take 1 tablet by mouth every 6 (six) hours as needed for pain.      . metoprolol (LOPRESSOR) 100 MG tablet Take 100 mg by mouth 2 (two) times daily.      . nitroGLYCERIN (NITROSTAT) 0.4 MG SL tablet Place 1 tablet (0.4 mg total) under the tongue every 5 (five) minutes x 3 doses as needed for chest pain.  25 tablet  3  . omeprazole (PRILOSEC) 20 MG capsule Take 20 mg by mouth daily.      . potassium chloride SA (K-DUR,KLOR-CON) 10 MEQ tablet Take 1 tablet (10 mEq total) by mouth daily.  30 tablet  6  . temazepam (RESTORIL) 15 MG capsule Take 15 mg by mouth at bedtime as needed for sleep.      . prasugrel (EFFIENT) 5 MG TABS tablet Take 1 tablet (5 mg total) by mouth daily.  30 tablet  3   No current facility-administered medications for this visit.    Past Medical History  Diagnosis Date  . Essential  hypertension, benign   . Coronary atherosclerosis of native coronary artery     a. s/p prior stenting of the LCX and RCA @ [facility];  b. 08/2013 NSTEMI/Cath/PCI: LM 20d, LAd 30-49m, LCX patent prox stent, 77m LCX @ bifurcation of OM1/OM2 (2.5x16 Promus Premier DES, PTCA ostial OM2), RCA 50-70p ISR, 30d, EF 55-65%;  b. 08/2013 Echo: EF 55%, basalinf/infsept HK, Gr 1 DD, possibly bicuspid Ao Valve.  . Stroke   . COPD (chronic obstructive pulmonary disease)   . Hyperlipidemia     Social History Savannah Carlson reports that she has quit smoking. Her smoking  use included Cigarettes. She smoked 0.00 packs per day. She does not have any smokeless tobacco history on file. Savannah Carlson reports that she does not drink alcohol.  Review of Systems No palpitations. No reported melena or hematochezia. No orthopnea or PND. Otherwise as outlined.  Physical Examination Filed Vitals:   10/02/13 1133  BP: 104/62  Pulse: 64   Filed Weights   10/02/13 1133  Weight: 123 lb (55.792 kg)   Patient appears anxious, but in no acute distress. HEENT: Conjunctiva and lids normal, oropharynx clear. Neck: Supple, no elevated JVP or carotid bruits, no thyromegaly. Lungs: Diminished but clear to auscultation, nonlabored breathing at rest. Cardiac: Regular rate and rhythm, no S3, soft systolic murmur, no pericardial rub. Abdomen: Soft, nontender, bowel sounds present, no guarding or rebound. Extremities: No pitting edema, distal pulses 2+. Skin: Warm and dry. Musculoskeletal: No kyphosis. Neuropsychiatric: Alert and oriented x3, affect grossly appropriate.   Problem List and Plan   Coronary atherosclerosis of native coronary artery Recent NSTEMI status post DES and PTCA to the OM1 and OM2 bifurcation by Dr. Swaziland. Records reviewed. See above discussion. Plan is to change from Brilinta to Effient 5 mg daily (weight less than 60 kg). I hope that she will tolerate this perhaps somewhat better, and at a reasonable bleeding risk. Plan to see her back in one month's time with a CBC. I underscored the importance of DAPT in light of her recent DES.  Hyperlipidemia She is on high-dose Lipitor (new) at this point. Recent LDL 87. Recheck FLP and LFT for next visit.  Essential hypertension Blood pressure is normal today. No changes made.  COPD (chronic obstructive pulmonary disease) Prior history of tobacco use.    Jonelle Sidle, M.D., F.A.C.C.

## 2013-10-02 NOTE — Assessment & Plan Note (Signed)
Blood pressure is normal today. No changes made. 

## 2013-10-02 NOTE — Assessment & Plan Note (Addendum)
She is on high-dose Lipitor (new) at this point. Recent LDL 87. Recheck FLP and LFT for next visit.

## 2013-10-02 NOTE — Assessment & Plan Note (Signed)
Recent NSTEMI status post DES and PTCA to the OM1 and OM2 bifurcation by Dr. Swaziland. Records reviewed. See above discussion. Plan is to change from Brilinta to Effient 5 mg daily (weight less than 60 kg). I hope that she will tolerate this perhaps somewhat better, and at a reasonable bleeding risk. Plan to see her back in one month's time with a CBC. I underscored the importance of DAPT in light of her recent DES.

## 2013-10-02 NOTE — Assessment & Plan Note (Signed)
Prior history of tobacco use.

## 2013-10-02 NOTE — Patient Instructions (Signed)
Your physician recommends that you schedule a follow-up appointment in: 1 month. Your physician has recommended you make the following change in your medication: STOP BRILINTA. START EFFIENT 5 MG DAILY. Your new prescription has been sent to your pharmacy. All other medications will remain the same. Your physician recommends that you have lab work in 1 months just before your next appointment with Dr. Diona Browner. Please have fasting labs (CBC,Liver/lipid function) either at your family doctor's office or [facility]                      [address] (across from [employer]) M-F 7:00 am to 6:00 pm  Sat 8:00 am to 12:00

## 2013-10-25 DIAGNOSIS — J449 Chronic obstructive pulmonary disease, unspecified: Secondary | ICD-10-CM

## 2013-10-25 DIAGNOSIS — E785 Hyperlipidemia, unspecified: Secondary | ICD-10-CM

## 2013-10-25 DIAGNOSIS — Z79899 Other long term (current) drug therapy: Secondary | ICD-10-CM

## 2013-10-25 DIAGNOSIS — I2510 Atherosclerotic heart disease of native coronary artery without angina pectoris: Secondary | ICD-10-CM

## 2013-10-25 DIAGNOSIS — I10 Essential (primary) hypertension: Secondary | ICD-10-CM

## 2013-10-31 ENCOUNTER — Telehealth: Payer: Self-pay | Admitting: Cardiology

## 2013-10-31 NOTE — Telephone Encounter (Signed)
Message copied by Burnice Logan on Thu Oct 31, 2013  4:39 PM ------      Message from: MCDOWELL, Illene Bolus      Created: Wed Oct 30, 2013 10:07 AM       Reviewed. Hemoglobin is stable, 13.8 which is in the normal range. Additional lab work reviewed finding normal renal function, normal AST and ALT. Cholesterol control very good with LDL 61. ------

## 2013-10-31 NOTE — Telephone Encounter (Signed)
L/m for pt to return call

## 2013-11-01 NOTE — Telephone Encounter (Signed)
Patient informed via message machine. 

## 2013-11-07 ENCOUNTER — Encounter: Payer: Self-pay | Admitting: Cardiology

## 2013-11-07 ENCOUNTER — Encounter: Payer: Medicaid - Out of State | Admitting: Cardiology

## 2013-11-07 NOTE — Progress Notes (Signed)
No show  This encounter was created in error - please disregard.

## 2013-12-30 ENCOUNTER — Encounter: Payer: Medicaid - Out of State | Admitting: Cardiology

## 2013-12-30 ENCOUNTER — Encounter: Payer: Self-pay | Admitting: Cardiology

## 2013-12-30 NOTE — Progress Notes (Signed)
pPatient canceled This encounter was created in error - please disregard.

## 2014-01-21 ENCOUNTER — Ambulatory Visit (INDEPENDENT_AMBULATORY_CARE_PROVIDER_SITE_OTHER): Payer: Medicaid - Out of State | Admitting: Cardiology

## 2014-01-21 ENCOUNTER — Encounter: Payer: Self-pay | Admitting: Cardiology

## 2014-01-21 VITALS — BP 111/69 | HR 53 | Ht 60.0 in | Wt 124.0 lb

## 2014-01-21 DIAGNOSIS — J449 Chronic obstructive pulmonary disease, unspecified: Secondary | ICD-10-CM

## 2014-01-21 DIAGNOSIS — I10 Essential (primary) hypertension: Secondary | ICD-10-CM

## 2014-01-21 DIAGNOSIS — R0602 Shortness of breath: Secondary | ICD-10-CM

## 2014-01-21 DIAGNOSIS — I2510 Atherosclerotic heart disease of native coronary artery without angina pectoris: Secondary | ICD-10-CM

## 2014-01-21 DIAGNOSIS — E785 Hyperlipidemia, unspecified: Secondary | ICD-10-CM

## 2014-01-21 NOTE — Assessment & Plan Note (Signed)
Blood pressure is normal today. 

## 2014-01-21 NOTE — Assessment & Plan Note (Signed)
Good lipid control as of October with LDL 61 on statin therapy. No changes made at this point.

## 2014-01-21 NOTE — Progress Notes (Signed)
Clinical Summary Savannah Carlson is a 68 y.o.female seen by me for the first time in October of 2014. She had had an NSTEMI around that time, status post DES and PTCA to the OM1 and OM 2 bifurcation by Dr. SwazilandJordan and was placed on DAPT. Please see prior note regarding concerns by patient at that time. She was changed from Brilinta to Effient and was to followup in a month, did not make that visit.  Lab work in October 2014 showed hemoglobin 13.8, platelets 164, BUN 16, creatinine 0.7, potassium 4.4, normal AST and ALT, cholesterol 127, LDL 61.  Echocardiogram in September 2014 revealed mild basal septal hypertrophy with LVEF 55%, hypokinesis of the basal inferior and inferoseptal segment, grade 1 diastolic dysfunction, possible bicuspid aortic valve, mild left atrial enlargement.  She will be seeing Dr. Yetta BarreJones soon for primary care establishment.  She tells me that she has been taking her medications, has skipped "maybe 7" doses of Effient. Reports no major bleeding problems.  She states that she has frequent chest pain symptoms, typically at nighttime when she is lying down, also reflux. She does not have any exertional chest pain however.   No Known Allergies  Current Outpatient Prescriptions  Medication Sig Dispense Refill  . amLODipine (NORVASC) 5 MG tablet Take 1 tablet (5 mg total) by mouth daily.  30 tablet  6  . aspirin 81 MG tablet Take 1 tablet (81 mg total) by mouth daily.      Marland Kitchen. atorvastatin (LIPITOR) 80 MG tablet Take 1 tablet (80 mg total) by mouth daily at 6 PM.  30 tablet  6  . enalapril (VASOTEC) 10 MG tablet Take 10 mg by mouth 2 (two) times daily.      . furosemide (LASIX) 20 MG tablet Take 20 mg by mouth daily.      Marland Kitchen. HYDROcodone-acetaminophen (NORCO) 10-325 MG per tablet Take 1 tablet by mouth every 6 (six) hours as needed for pain.      . metoprolol (LOPRESSOR) 100 MG tablet Take 100 mg by mouth 2 (two) times daily.      . nitroGLYCERIN (NITROSTAT) 0.4 MG SL tablet  Place 1 tablet (0.4 mg total) under the tongue every 5 (five) minutes x 3 doses as needed for chest pain.  25 tablet  3  . omeprazole (PRILOSEC) 20 MG capsule Take 20 mg by mouth daily.      . potassium chloride SA (K-DUR,KLOR-CON) 10 MEQ tablet Take 1 tablet (10 mEq total) by mouth daily.  30 tablet  6  . prasugrel (EFFIENT) 5 MG TABS tablet Take 1 tablet (5 mg total) by mouth daily.  30 tablet  3  . temazepam (RESTORIL) 15 MG capsule Take 15 mg by mouth at bedtime as needed for sleep.       No current facility-administered medications for this visit.    Past Medical History  Diagnosis Date  . Essential hypertension, benign   . Coronary atherosclerosis of native coronary artery     a. s/p prior stenting of the LCX and RCA @ [facility];  b. 08/2013 NSTEMI/Cath/PCI: LM 20d, LAd 30-8061m, LCX patent prox stent, 2245m LCX @ bifurcation of OM1/OM2 (2.5x16 Promus Premier DES, PTCA ostial OM2), RCA 50-70p ISR, 30d, EF 55-65%;  b. 08/2013 Echo: EF 55%, basalinf/infsept HK, Gr 1 DD, possibly bicuspid Ao Valve.  . Stroke   . COPD (chronic obstructive pulmonary disease)   . Hyperlipidemia     Social History Ms. Waldschmidt reports that she has  quit smoking. Her smoking use included Cigarettes. She smoked 0.00 packs per day. She does not have any smokeless tobacco history on file. Ms. Hakeem reports that she does not drink alcohol.  Review of Systems As outlined above. Also under stress, some depression symptoms. Her grandson is living with her. Still grieving the loss of her husband.  Physical Examination Filed Vitals:   01/21/14 1612  BP: 111/69  Pulse: 53   Filed Weights   01/21/14 1612  Weight: 124 lb (56.246 kg)    Appears comfortable at rest. HEENT: Conjunctiva and lids normal, oropharynx clear.  Neck: Supple, no elevated JVP or carotid bruits, no thyromegaly.  Lungs: Diminished but clear to auscultation, nonlabored breathing at rest.  Cardiac: Regular rate and rhythm, no S3, soft systolic  murmur, no pericardial rub.  Abdomen: Soft, nontender, bowel sounds present, no guarding or rebound.  Extremities: No pitting edema, distal pulses 2+.  Skin: Warm and dry.  Musculoskeletal: No kyphosis.  Neuropsychiatric: Alert and oriented x3, affect grossly appropriate.   Problem List and Plan   Coronary atherosclerosis of native coronary artery Continue medical therapy and observation at this point. I once again stressed the importance of uninterrupted DAPT within the first year of DES placement. She states she will be consistent, and has tolerated Effient. Her chest pain symptoms sound more GI in etiology than ischemic. Followup CBC and BMET for next visit in 3 months.  Hyperlipidemia Good lipid control as of October with LDL 61 on statin therapy. No changes made at this point.  Essential hypertension Blood pressure is normal today.  COPD (chronic obstructive pulmonary disease) Chronic shortness of breath. No active tobacco use.    Jonelle Sidle, M.D., F.A.C.C.

## 2014-01-21 NOTE — Assessment & Plan Note (Signed)
Continue medical therapy and observation at this point. I once again stressed the importance of uninterrupted DAPT within the first year of DES placement. She states she will be consistent, and has tolerated Effient. Her chest pain symptoms sound more GI in etiology than ischemic. Followup CBC and BMET for next visit in 3 months.

## 2014-01-21 NOTE — Assessment & Plan Note (Signed)
Chronic shortness of breath. No active tobacco use.

## 2014-01-21 NOTE — Patient Instructions (Signed)
Your physician recommends that you schedule a follow-up appointment in: 3 months. You will receive a reminder letter in the mail in about 1 month reminding you to call and schedule your appointment. If you don't receive this letter, please contact our office. Your physician recommends that you continue on your current medications as directed. Please refer to the Current Medication list given to you today. Your physician recommends that you have lab work in 3 months just before your next visit to check a BMET and CBC.

## 2014-04-14 IMAGING — CT CT ANGIO CHEST
2 of 9 series · 19 of 46 positions shown · IV contrast (APPLIED)
Comparison: 09/20/2013 chest radiograph

CLINICAL DATA: 67-year-old female with chest pain.

CT ANGIOGRAPHY CHEST
TECHNIQUE: Multidetector CT imaging of the chest using the
standard protocol during bolus administration of intravenous
contrast. Multiplanar reconstructed images including MIPs were
obtained and reviewed to evaluate the vascular anatomy.
Contrast: 100mL OMNIPAQUE IOHEXOL 350 MG/ML SOLN

[Series 5: thins · axial · 0.60mm/px · z∈[+145,+369]mm · 16 of 252 slices shown]
[im 14/252  lung]
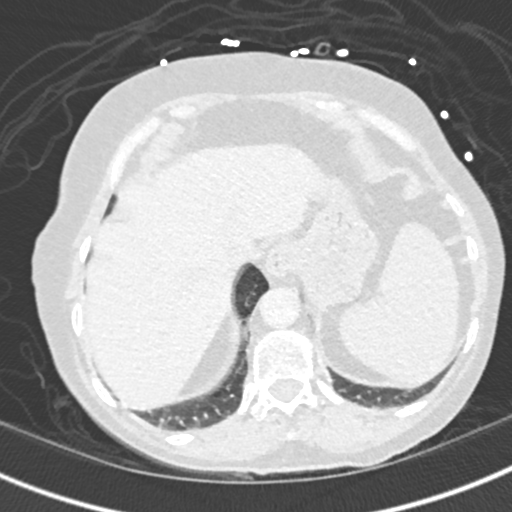
[im 28/252  soft-tissue]
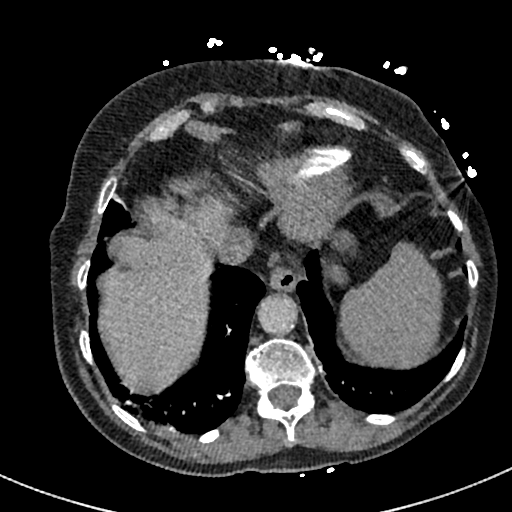
[im 42/252  lung]
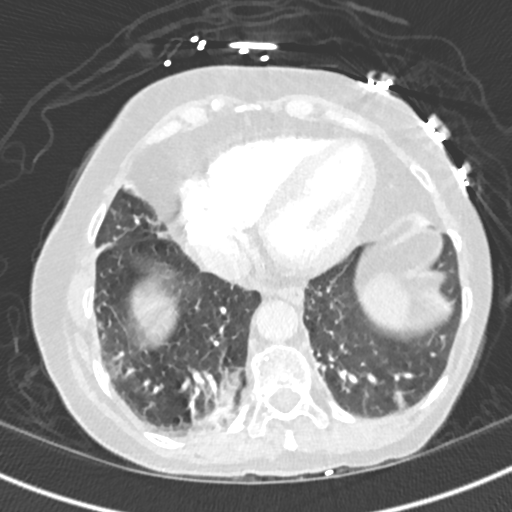
[im 56/252  soft-tissue]
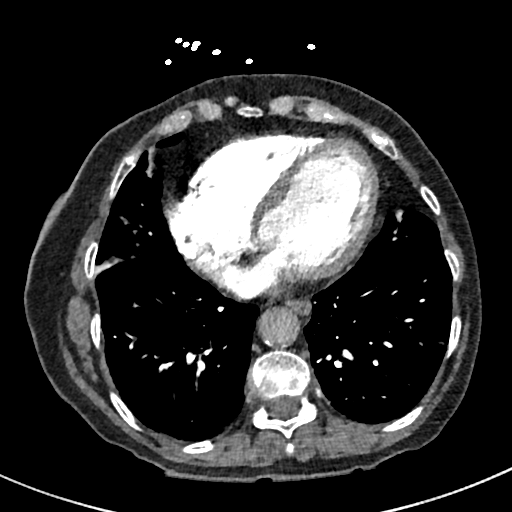
[im 70/252  lung]
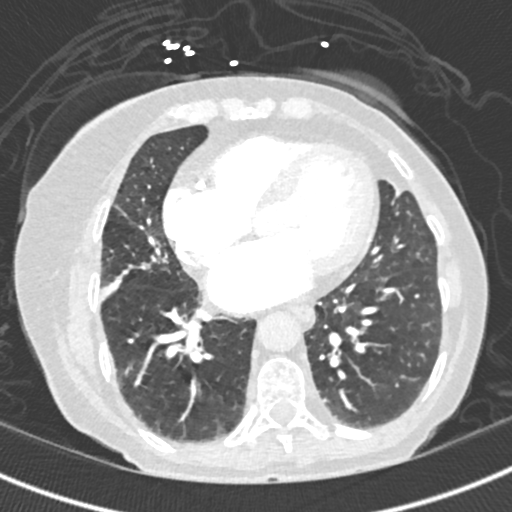
[im 84/252  soft-tissue]
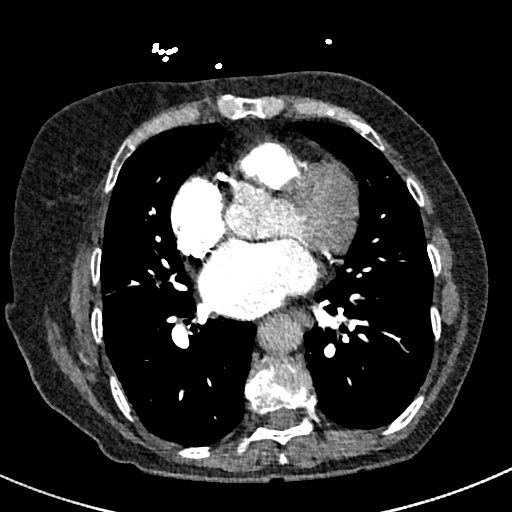
[im 98/252  lung]
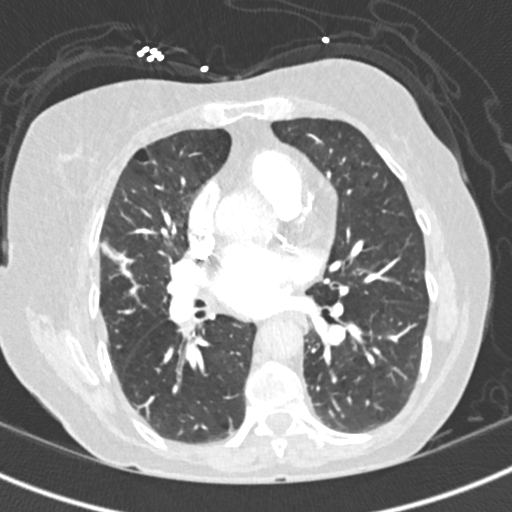
[im 112/252  soft-tissue]
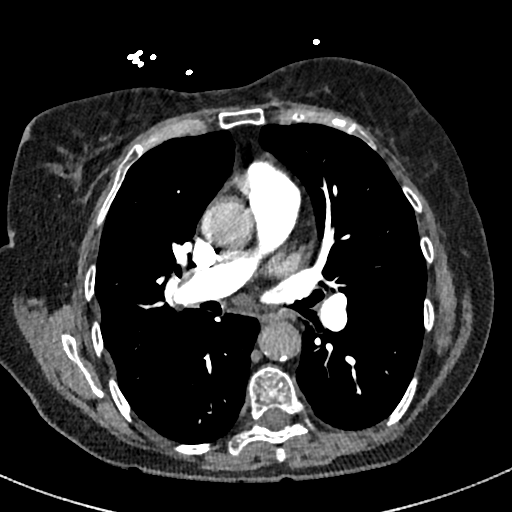
[im 140/252  lung]
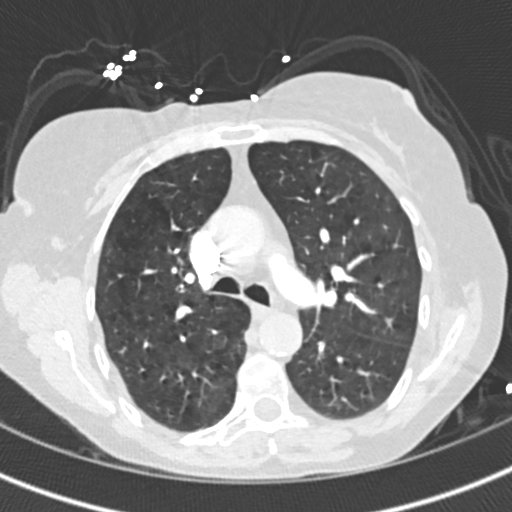
[im 154/252  soft-tissue]
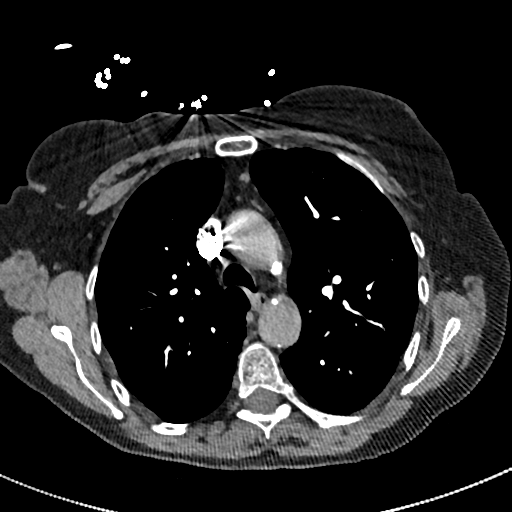
[im 168/252  lung]
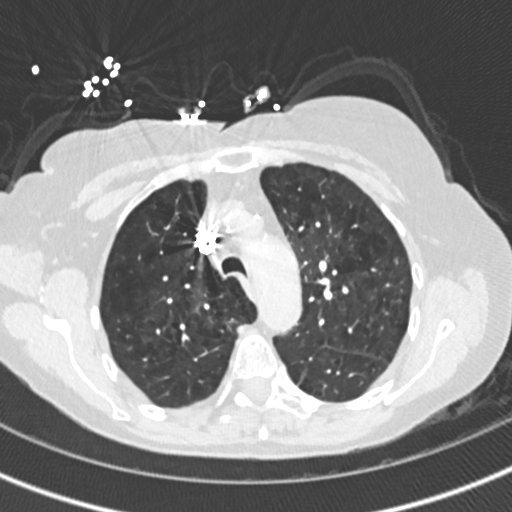
[im 182/252  soft-tissue]
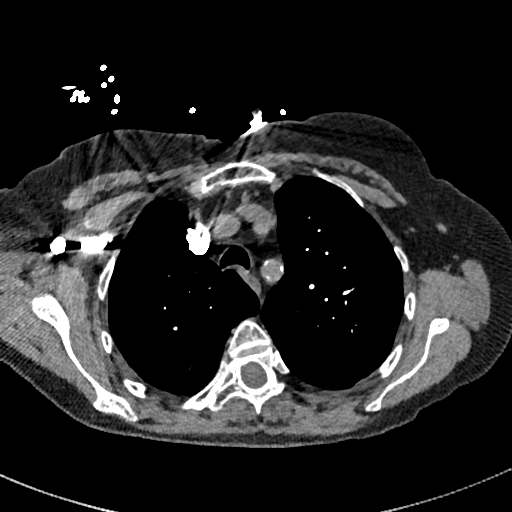
[im 196/252  lung]
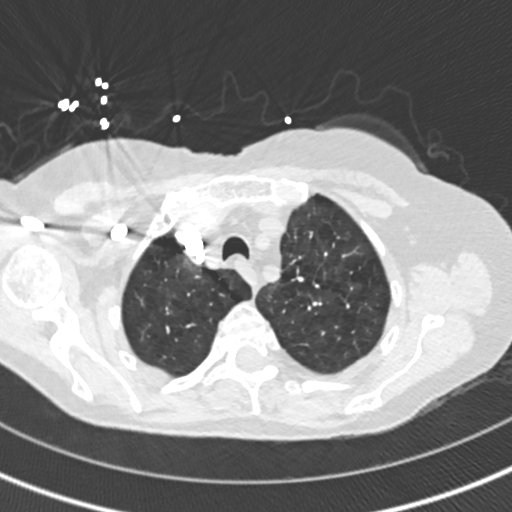
[im 210/252  soft-tissue]
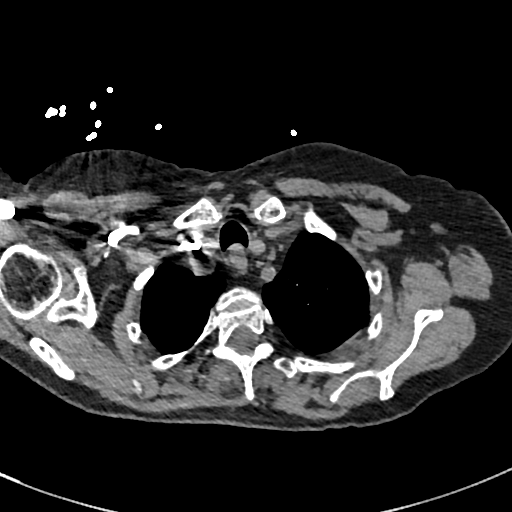
[im 224/252  lung]
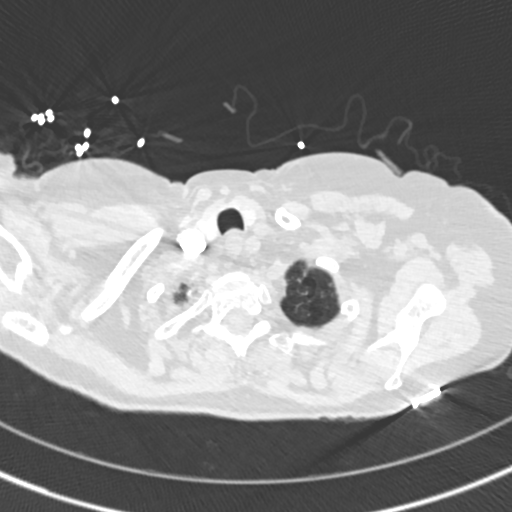
[im 238/252  soft-tissue]
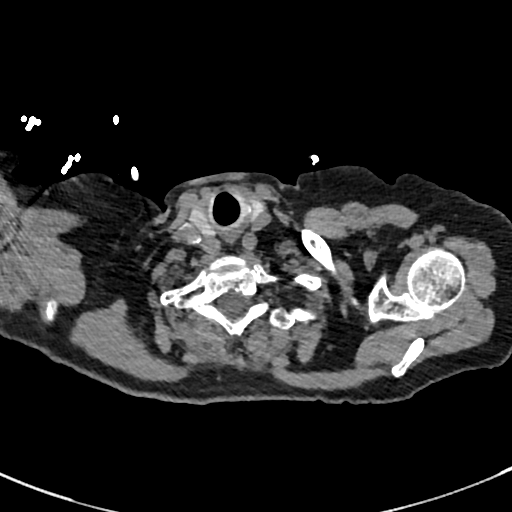

[Series 7: coronal mpr · coronal · 0.59mm/px · 3 of 117 slices shown]
[im 30/117  soft-tissue]
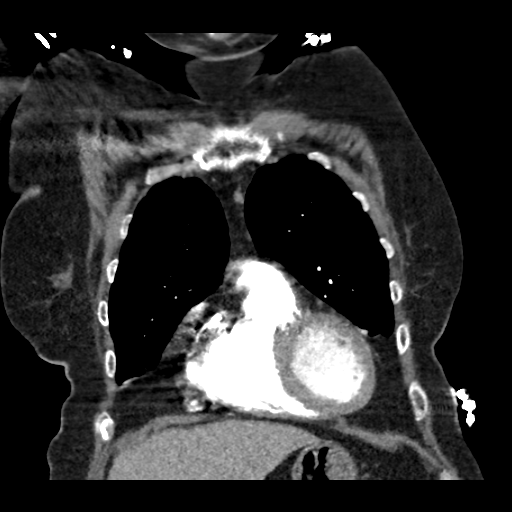
[im 59/117  soft-tissue]
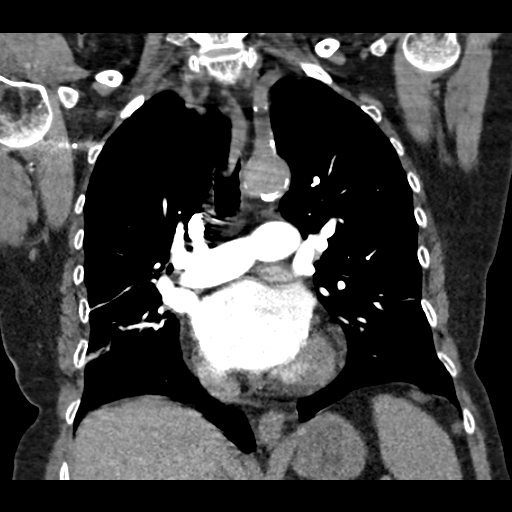
[im 88/117  soft-tissue]
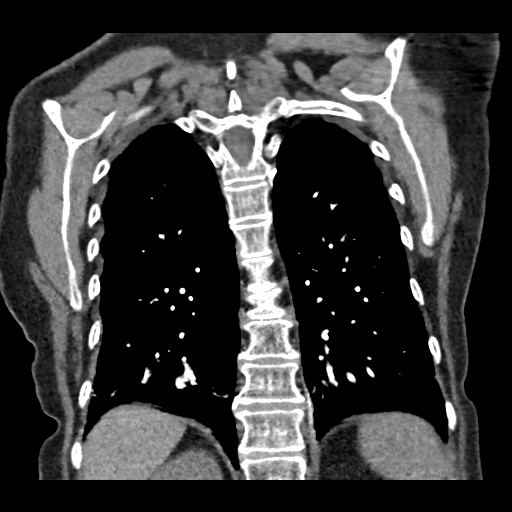

[19 of 46 positions shown; findings below may reference images not displayed]

FINDINGS: This is a technically satisfactory study.

No pulmonary emboli are identified.
There is no evidence of thoracic aortic aneurysm.
Heavy coronary artery calcifications are present as well as
cardiomegaly.

There is no evidence of pleural or pericardial effusion or enlarged
lymph nodes.

Centrilobular emphysema is noted.
Bilateral lower lung atelectasis/scarring is present.
There is no evidence of pulmonary mass, airspace disease,
consolidation, nodule or endobronchial/endotracheal lesion.

Fractures of the anterior lateral right fourth and fifth ribs are
age indeterminate.
IMPRESSION: No evidence of pulmonary emboli.

Cardiomegaly, coronary artery disease and emphysema..

Fractures of the anterolateral right fourth and fifth ribs - age
indeterminate.  Correlate with pain.

## 2014-04-15 ENCOUNTER — Telehealth: Payer: Self-pay | Admitting: *Deleted

## 2014-04-15 NOTE — Telephone Encounter (Signed)
Message copied by Eustace MooreANDERSON, Adalei Novell M on Tue Apr 15, 2014  3:43 PM ------      Message from: MCDOWELL, Illene BolusSAMUEL G      Created: Mon Apr 14, 2014 11:49 AM       Reviewed. Hemoglobin, platelets, and creatinine are all normal. ------

## 2014-04-21 ENCOUNTER — Encounter: Payer: Medicaid - Out of State | Admitting: Cardiology

## 2014-04-21 ENCOUNTER — Encounter: Payer: Self-pay | Admitting: Cardiology

## 2014-04-21 NOTE — Progress Notes (Signed)
Patient rescheduled.  This encounter was created in error - please disregard. 

## 2014-04-28 ENCOUNTER — Other Ambulatory Visit (HOSPITAL_COMMUNITY): Payer: Self-pay | Admitting: Nurse Practitioner

## 2014-04-29 ENCOUNTER — Encounter: Payer: Self-pay | Admitting: *Deleted

## 2014-04-29 NOTE — Telephone Encounter (Signed)
Results mailed to home address

## 2014-05-26 ENCOUNTER — Encounter: Payer: Self-pay | Admitting: Cardiology

## 2014-05-26 ENCOUNTER — Encounter: Payer: Medicaid - Out of State | Admitting: Cardiology

## 2014-05-26 NOTE — Progress Notes (Signed)
NNo-show. This encounter was created in error - please disregard. 

## 2014-08-09 ENCOUNTER — Other Ambulatory Visit (HOSPITAL_COMMUNITY): Payer: Self-pay | Admitting: Nurse Practitioner

## 2014-08-26 ENCOUNTER — Encounter: Payer: Self-pay | Admitting: Cardiology

## 2014-08-26 ENCOUNTER — Encounter: Payer: Medicaid - Out of State | Admitting: Cardiology

## 2014-08-26 NOTE — Progress Notes (Signed)
No show  This encounter was created in error - please disregard.

## 2014-09-22 ENCOUNTER — Other Ambulatory Visit (HOSPITAL_COMMUNITY): Payer: Self-pay | Admitting: Nurse Practitioner

## 2014-09-22 ENCOUNTER — Other Ambulatory Visit: Payer: Self-pay | Admitting: *Deleted

## 2014-09-22 MED ORDER — POTASSIUM CHLORIDE CRYS ER 10 MEQ PO TBCR: 10.0000 meq | EXTENDED_RELEASE_TABLET | Freq: Every day | ORAL | Status: DC

## 2014-12-04 ENCOUNTER — Encounter (HOSPITAL_COMMUNITY): Payer: Self-pay | Admitting: Cardiology

## 2015-02-19 ENCOUNTER — Other Ambulatory Visit: Payer: Self-pay | Admitting: Cardiology

## 2015-04-19 ENCOUNTER — Other Ambulatory Visit: Payer: Self-pay | Admitting: Cardiology

## 2015-06-13 ENCOUNTER — Other Ambulatory Visit: Payer: Self-pay | Admitting: Cardiology

## 2015-07-07 ENCOUNTER — Other Ambulatory Visit: Payer: Self-pay | Admitting: Cardiology

## 2015-09-14 ENCOUNTER — Other Ambulatory Visit: Payer: Self-pay | Admitting: Cardiology

## 2015-11-26 ENCOUNTER — Other Ambulatory Visit: Payer: Self-pay | Admitting: Cardiology

## 2016-08-18 ENCOUNTER — Telehealth: Payer: Self-pay | Admitting: Cardiology

## 2016-08-18 ENCOUNTER — Encounter: Payer: Self-pay | Admitting: *Deleted

## 2016-08-18 NOTE — Telephone Encounter (Signed)
D/[facility] summary received [facility], Isosorbide mononitrate 30 mg daily was started. Pt aware that this may not be addressed by provider until appt

## 2016-08-18 NOTE — Telephone Encounter (Signed)
Isosorbide - hosp started her on medication but she doesn't want to take until she hears back from Dr Diona BrownerMcDowell if it is safe or not to take.

## 2016-08-18 NOTE — Telephone Encounter (Signed)
Pt says [facility], says they did stress test and suggested she have by pass surgery (records requested) and gave her isosorbide 30 mg daily - Pt says she is reluctant to take this without discussion with Dr. Diona BrownerMcDowell - pt has appt 9/1 with Dr. Diona BrownerMcDowell, but hasn't been seen since 12/2013. Pt says husband died and she didn't take care of herself is why she hasn't f/u. Will forward to Dr. Diona BrownerMcDowell

## 2016-08-19 NOTE — Telephone Encounter (Signed)
Noted. Last visit with me was in 2015. Please get the records from [facility] as suggested. It would be difficult to imagine that they recommended bypass surgery based solely on a stress test, so reviewing the records will be important. Imdur is a reasonable choice as an antianginal, suggest that she take what was recommended by her evaluating providers until I can see her in the office.

## 2016-08-19 NOTE — Telephone Encounter (Signed)
Pt aware.

## 2016-08-26 ENCOUNTER — Ambulatory Visit: Payer: Medicaid - Out of State | Admitting: Cardiology

## 2016-08-26 NOTE — Progress Notes (Deleted)
Cardiology Office Note  Date: 08/26/2016   ID: Savannah Carlson, DOB Sep 16, 1946, MRN [id number]  PCP: Christena Flake, MD  Primary Cardiologist: Nona Dell, MD   No chief complaint on file.   History of Present Illness: Savannah Carlson is a 70 y.o. female not seen in the office since January 2015. She has not maintained regular follow-up. Record review finds recent evaluation in [place] for chest pain. Troponin I levels were negative under observation and her ECG showed nonspecific ST segment changes. She underwent a Lexiscan Myoview on August 22 which was negative for ischemia and revealed LVEF 59%. Echocardiography also revealed normal LV function and no major valvular abnormalities.  Last coronary intervention was in 2014 with placement of DES to the OM1 and PTCA of the OM 2.  Past Medical History:  Diagnosis Date  . COPD (chronic obstructive pulmonary disease)   . Coronary atherosclerosis of native coronary artery    a. s/p prior stenting of the LCX and RCA @ [facility];  b. 08/2013 NSTEMI/Cath/PCI: LM 20d, LAd 30-35m, LCX patent prox stent, 69m LCX @ bifurcation of OM1/OM2 (2.5x16 Promus Premier DES, PTCA ostial OM2), RCA 50-70p ISR, 30d, EF 55-65%   . Essential hypertension, benign   . Hyperlipidemia   . Stroke     Past Surgical History:  Procedure Laterality Date  . LEFT HEART CATHETERIZATION WITH CORONARY ANGIOGRAM N/A 09/23/2013   Procedure: LEFT HEART CATHETERIZATION WITH CORONARY ANGIOGRAM;  Surgeon: Peter M Swaziland, MD;  Location: [facility] CATH LAB;  Service: Cardiovascular;  Laterality: N/A;  . PERCUTANEOUS CORONARY STENT INTERVENTION (PCI-S)  09/23/2013   Procedure: PERCUTANEOUS CORONARY STENT INTERVENTION (PCI-S);  Surgeon: Peter M Swaziland, MD;  Location: [facility] CATH LAB;  Service: Cardiovascular;;    Current Outpatient Prescriptions  Medication Sig Dispense Refill  . amLODipine (NORVASC) 5 MG tablet TAKE 1 TABLET BY MOUTH EVERY DAY 3 tablet 0  . aspirin 81 MG tablet Take 1  tablet (81 mg total) by mouth daily.    Marland Kitchen atorvastatin (LIPITOR) 80 MG tablet TAKE 1 TABLET BY MOUTH AT 6 PM 7 tablet 0  . enalapril (VASOTEC) 10 MG tablet Take 10 mg by mouth 2 (two) times daily.    . furosemide (LASIX) 20 MG tablet Take 20 mg by mouth daily.    Marland Kitchen HYDROcodone-acetaminophen (NORCO) 10-325 MG per tablet Take 1 tablet by mouth every 6 (six) hours as needed for pain.    . metoprolol (LOPRESSOR) 100 MG tablet Take 100 mg by mouth 2 (two) times daily.    . nitroGLYCERIN (NITROSTAT) 0.4 MG SL tablet Place 1 tablet (0.4 mg total) under the tongue every 5 (five) minutes x 3 doses as needed for chest pain. 25 tablet 3  . omeprazole (PRILOSEC) 20 MG capsule Take 20 mg by mouth daily.    . potassium chloride (K-DUR,KLOR-CON) 10 MEQ tablet Take 1 tablet (10 mEq total) by mouth daily. 30 tablet 1  . prasugrel (EFFIENT) 5 MG TABS tablet Take 1 tablet (5 mg total) by mouth daily. 30 tablet 3  . temazepam (RESTORIL) 15 MG capsule Take 15 mg by mouth at bedtime as needed for sleep.     No current facility-administered medications for this visit.    Allergies:  Review of patient's allergies indicates no known allergies.   Social History: The patient  reports that she has quit smoking. Her smoking use included Cigarettes. She does not have any smokeless tobacco history on file. She reports that she does not drink alcohol or use drugs.  Family History: The patient's family history is not on file.   ROS:  Please see the history of present illness. Otherwise, complete review of systems is positive for {NONE DEFAULTED:18576::"none"}.  All other systems are reviewed and negative.   Physical Exam: VS:  There were no vitals taken for this visit., BMI There is no height or weight on file to calculate BMI.  Wt Readings from Last 3 Encounters:  01/21/14 124 lb (56.2 kg)  10/02/13 123 lb (55.8 kg)  09/25/13 121 lb 14.6 oz (55.3 kg)    Appears comfortable at rest. HEENT: Conjunctiva and lids  normal, oropharynx clear.  Neck: Supple, no elevated JVP or carotid bruits, no thyromegaly.  Lungs: Diminished but clear to auscultation, nonlabored breathing at rest.  Cardiac: Regular rate and rhythm, no S3, soft systolic murmur, no pericardial rub.  Abdomen: Soft, nontender, bowel sounds present, no guarding or rebound.  Extremities: No pitting edema, distal pulses 2+.  Skin: Warm and dry.  Musculoskeletal: No kyphosis.  Neuropsychiatric: Alert and oriented x3, affect grossly appropriate.  ECG: I personally reviewed the tracing from 08/15/2016 which showed sinus arrhythmia with nonspecific ST changes.  Recent Labwork:  August 2017: BUN 10, creatinine 0.7, potassium 3.5, hemoglobin 13.1, platelets 132, troponin I 0.01, BNP 126, triglycerides 184, cholesterol 125, LDL 60, HDL 28  Other Studies Reviewed Today:  Lexiscan Myoview 08/16/2016 [facility]): No diagnostic ST segment changes. LVEF 59%. No significant fixed or reversible perfusion defects to indicate scar or ischemia.  Echocardiogram 08/15/2016 [facility]([place]): Moderate LVH with LVEF 65-70%, mild mitral regurgitation, no pericardial effusion, PASP 35 mmHg.  Chest x-ray 08/15/2016 [facility]([place]): Bibasilar scarring with no acute findings.  PROCEDURAL FINDINGS Hemodynamics: AO 149/62 mean 95 mm Hg LV 145/12 mm Hg          Cardiac catheterization 09/23/2013:                Coronary angiography: Coronary dominance: right  Left mainstem: Moderate calcification with 20% distal disease.  Left anterior descending (LAD): Moderate calcification with 30-40% disease in the mid LAD.   Left circumflex (LCx): Stent in the proximal vessel is widely patent. There is a 90% stenosis in the mid LCx at the bifurcation of the OM1 and OM2 vessels.  Right coronary artery (RCA): There is a stent in the proximal to mid RCA. There is a focal 50-70% stenosis in the proximal stent. The remainder of the stent has diffuse 30% disease. The rest of  the RCA is without significant disease.  Left ventriculography: Left ventricular systolic function is normal, LVEF is estimated at 55-65%, there is no significant mitral regurgitation   PCI Note:  Following the diagnostic procedure, the decision was made to proceed with PCI. Brilinta 180 mg was given orally. Weight-based bivalirudin was given for anticoagulation. Once a therapeutic ACT was achieved, a 6 [place] XBLAD 3.5 guide catheter was inserted.  A prowater coronary guidewire was used to cross the lesion in the OM1. A second prowater was placed in the OM2.  The OM2 ostium lesion was predilated with a 2.5 mm balloon.  We were unable to cross this lesion with a cutting balloon. We were able to dilate the ostium of OM2 with a 2.5 mm Angiosculpt balloon.The lesion In OM1 was then stented with a 2.5 x 16 mm Promus premier stent covering the origin of OM2.  The stent was postdilated with a 2.75 mm noncompliant balloon in the more proximal portion of the stent.  Following PCI, there was 0% residual stenosis  and TIMI-3 flow. There was less than 30% stenosis at the origin of OM2. Final angiography confirmed an excellent result. The patient tolerated the procedure well. There were no immediate procedural complications. A TR band was used for radial hemostasis. The patient was transferred to the post catheterization recovery area for further monitoring.  PCI Data: Vessel - LCx/Segment - Bifurcation of OM1/OM2 Percent Stenosis (pre)  90% TIMI-flow 3 Stent 2.5 x 16 mm Promus premier Percent Stenosis (post) 0% TIMI-flow (post) 3  Final Conclusions:   1. Single vessel obstructive CAD. 2. Successful PCI with stenting of OM1 with a DES and PTCA of the ostium of OM2.  Assessment and Plan:    Current medicines were reviewed with the patient today.  No orders of the defined types were placed in this encounter.   Disposition:  Signed, Jonelle Sidle, MD, [facility] 08/26/2016 8:21 AM    [facility] at [facility] [address] [place], [place], [place] [address] Phone: [phone]; Fax: [phone]

## 2016-08-30 ENCOUNTER — Ambulatory Visit (INDEPENDENT_AMBULATORY_CARE_PROVIDER_SITE_OTHER): Payer: Non-veteran care | Admitting: Cardiology

## 2016-08-30 ENCOUNTER — Encounter: Payer: Self-pay | Admitting: Cardiology

## 2016-08-30 VITALS — BP 101/63 | HR 68 | Ht 60.0 in | Wt 124.8 lb

## 2016-08-30 DIAGNOSIS — M79604 Pain in right leg: Secondary | ICD-10-CM

## 2016-08-30 DIAGNOSIS — I10 Essential (primary) hypertension: Secondary | ICD-10-CM | POA: Diagnosis not present

## 2016-08-30 DIAGNOSIS — E785 Hyperlipidemia, unspecified: Secondary | ICD-10-CM | POA: Diagnosis not present

## 2016-08-30 DIAGNOSIS — M79605 Pain in left leg: Secondary | ICD-10-CM

## 2016-08-30 DIAGNOSIS — J449 Chronic obstructive pulmonary disease, unspecified: Secondary | ICD-10-CM

## 2016-08-30 DIAGNOSIS — I25119 Atherosclerotic heart disease of native coronary artery with unspecified angina pectoris: Secondary | ICD-10-CM | POA: Diagnosis not present

## 2016-08-30 NOTE — Progress Notes (Signed)
Cardiology Office Note  Date: 08/30/2016   ID: Savannah ChadJanice Moyers, DOB [birth year], MRN [id number]  PCP: Christena FlakeZIMMER,WILLIAM, MD  Primary Cardiologist: Nona DellSamuel Iris Hairston, MD   Chief Complaint  Patient presents with  . Coronary Artery Disease    History of Present Illness: Savannah Carlson is a 70 y.o. female not seen in the office since January 2015. She presents to reestablish cardiology follow-up, was [facility] with chest discomfort. Cardiac testing is outlined below, overall reassuring and low risk. She was started on Imdur.  I do not have complete information, but she tells me that she was told that she needed to have bypass surgery. It is not clear to me how this was determined based on the reassuring results of her recent cardiac testing. She did not have a heart catheterization. She does not report any recurring chest pain symptoms, states that she was not able to tolerate Imdur because it made her feel lightheaded and have a pounding heartbeat.  I reviewed her present cardiac regimen which is outlined below. She reports compliance with her medications other than the recent Imdur.  She reports chronic bilateral leg pain. Description seems most consistent with neuropathic pain, although she has not had any assessment for PAD.  Past Medical History:  Diagnosis Date  . COPD (chronic obstructive pulmonary disease) (HCC)   . Coronary atherosclerosis of native coronary artery    a. s/p prior stenting of the LCX and RCA @ [facility];  b. 08/2013 NSTEMI/Cath/PCI: LM 20d, LAd 30-5165m, LCX patent prox stent, 9070m LCX @ bifurcation of OM1/OM2 (2.5x16 Promus Premier DES, PTCA ostial OM2), RCA 50-70p ISR, 30d, EF 55-65%   . Essential hypertension, benign   . Hyperlipidemia   . Stroke [facility](HCC)     Past Surgical History:  Procedure Laterality Date  . LEFT HEART CATHETERIZATION WITH CORONARY ANGIOGRAM N/A 09/23/2013   Procedure: LEFT HEART CATHETERIZATION WITH CORONARY ANGIOGRAM;   Surgeon: Peter M SwazilandJordan, MD;  [facility];  Service: Cardiovascular;  Laterality: N/A;  . PERCUTANEOUS CORONARY STENT INTERVENTION (PCI-S)  09/23/2013   Procedure: PERCUTANEOUS CORONARY STENT INTERVENTION (PCI-S);  Surgeon: Peter M SwazilandJordan, MD;  [facility];  Service: Cardiovascular;;    Current Outpatient Prescriptions  Medication Sig Dispense Refill  . amLODipine (NORVASC) 5 MG tablet TAKE 1 TABLET BY MOUTH EVERY DAY 3 tablet 0  . aspirin 81 MG tablet Take 1 tablet (81 mg total) by mouth daily.    Marland Kitchen. atorvastatin (LIPITOR) 80 MG tablet TAKE 1 TABLET BY MOUTH AT 6 PM 7 tablet 0  . enalapril (VASOTEC) 10 MG tablet Take 10 mg by mouth 2 (two) times daily.    Marland Kitchen. HYDROcodone-acetaminophen (NORCO) 10-325 MG per tablet Take 1 tablet by mouth every 6 (six) hours as needed for pain.    . meclizine (ANTIVERT) 25 MG tablet Take 25 mg by mouth as needed for dizziness.    . metoprolol (LOPRESSOR) 100 MG tablet Take 100 mg by mouth 2 (two) times daily.    . nitroGLYCERIN (NITROSTAT) 0.4 MG SL tablet Place 1 tablet (0.4 mg total) under the tongue every 5 (five) minutes x 3 doses as needed for chest pain. 25 tablet 3  . omeprazole (PRILOSEC) 20 MG capsule Take 20 mg by mouth daily.     No current facility-administered medications for this visit.    Allergies:  Contrast media [iodinated diagnostic agents]   Social History: The patient  reports that she quit smoking about 19 years ago.  Her smoking use included Cigarettes. She has never used smokeless tobacco. She reports that she does not drink alcohol or use drugs.   Family History: The patient's family history is not on file.   ROS:  Please see the history of present illness. Otherwise, complete review of systems is positive for chronic leg pain and lower back pain.  All other systems are reviewed and negative.   Physical Exam: VS:  BP 101/63   Pulse 68   Ht 5' (1.524 m)   Wt 124 lb 12.8 oz (56.6 kg)   SpO2 94%   BMI 24.37 kg/m ,  BMI Body mass index is 24.37 kg/m.  Wt Readings from Last 3 Encounters:  08/30/16 124 lb 12.8 oz (56.6 kg)  01/21/14 124 lb (56.2 kg)  10/02/13 123 lb (55.8 kg)    Appears comfortable at rest. HEENT: Conjunctiva and lids normal, oropharynx clear.  Neck: Supple, no elevated JVP or carotid bruits, no thyromegaly.  Lungs: Diminished but clear to auscultation, nonlabored breathing at rest.  Cardiac: Regular rate and rhythm, no S3, soft systolic murmur, no pericardial rub.  Abdomen: Soft, nontender, bowel sounds present, no guarding or rebound.  Extremities: No pitting edema, distal pulses 2+.  Skin: Warm and dry.  Musculoskeletal: No kyphosis.  Neuropsychiatric: Alert and oriented x3, affect grossly appropriate.  ECG: I personally reviewed the tracing from 08/15/2016 which showed normal sinus rhythm with nonspecific ST changes.  Recent Labwork:  August 2017: BUN 10, creatinine 0.7, potassium 3.5, hemoglobin 13.1, platelet 132  Other Studies Reviewed Today:  Echocardiogram 08/15/2016 [facility]): Moderate LVH with LVEF 65-70%, normal left atrial size, mild mitral regurgitation, PASP 35 mmHg, no pericardial effusion.  Lexiscan Cardiolite 08/16/2016 [facility]): No diagnostic ST segment changes, no evidence of scar or ischemia by perfusion imaging, LVEF 59%.  Chest x-ray 08/15/2016 [facility]): Bibasilar scarring with normal heart size, changes consistent with COPD.  Assessment and Plan:  1. CAD status post DES and PTCA of the OM1 and OM 2 bifurcation back in October 2014 with prior history of stent placement to the circumflex and RCA. She is stable at this time without recurrent angina on present regimen. I reviewed recent available records including echocardiogram and Lexiscan Cardiolite done just recently in [place]. The studies were low risk with normal LVEF and no ischemia. Would recommend medical therapy and observation for now. She will stop Imdur due to  intolerances.  2. Hyperlipidemia, she continues on Lipitor. States that lab work is been followed by PCP. Would aim for goal LDL around 70.  3. Essential hypertension, blood pressure is well controlled today.  4. History of COPD with tobacco abuse in remission. No recent wheezing or decompensation.  5. Bilateral leg pain, possibly neuropathic although PAD not excluded. We will obtain lower extremities arterial Doppler and ABIs.  Current medicines were reviewed with the patient today.  No orders of the defined types were placed in this encounter.   Disposition: Follow-up with me in 6 months.  Signed, Jonelle Sidle, MD, [facility] 08/30/2016 3:58 PM    [place] [facility] at [facility] [address] [place], [place], [place] [address] Phone: [phone]; Fax: [phone]

## 2016-08-30 NOTE — Patient Instructions (Signed)
Your physician wants you to follow-up in: 6 MONTHS WITH DR. Diona BrownerMCDOWELL You will receive a reminder letter in the mail two months in advance. If you don't receive a letter, please call our office to schedule the follow-up appointment.  Your physician recommends that you continue on your current medications as directed. Please refer to the Current Medication list given to you today.  Your physician has requested that you have a lower extremity arterial exercise duplex. During this test, exercise and ultrasound are used to evaluate arterial blood flow in the legs. Allow one hour for this exam. There are no restrictions or special instructions.  Thank you for choosing [facility]!!

## 2016-09-01 ENCOUNTER — Other Ambulatory Visit: Payer: Self-pay | Admitting: Cardiology

## 2016-09-01 DIAGNOSIS — I739 Peripheral vascular disease, unspecified: Secondary | ICD-10-CM

## 2016-09-21 ENCOUNTER — Ambulatory Visit: Payer: PRIVATE HEALTH INSURANCE

## 2016-09-21 ENCOUNTER — Other Ambulatory Visit: Payer: Self-pay | Admitting: Cardiology

## 2016-09-21 DIAGNOSIS — I739 Peripheral vascular disease, unspecified: Secondary | ICD-10-CM

## 2016-09-21 DIAGNOSIS — R200 Anesthesia of skin: Secondary | ICD-10-CM

## 2016-09-21 DIAGNOSIS — R202 Paresthesia of skin: Secondary | ICD-10-CM

## 2016-09-21 LAB — VAS US LOWER EXTREMITY ARTERIAL DUPLEX
LATIBDISTSYS: 68 cm/s
LEFT PERO DIST SYS: 73 cm/s
LPOPDPSV: 78 cm/s
LPOPPPSV: 100 cm/s
LPTIBDISTSYS: 83 cm/s
LSFPPSV: -113 cm/s
Left super femoral dist sys PSV: -177 cm/s
Left super femoral mid sys PSV: 218 cm/s
RIGHT ANT DIST TIBAL SYS PSV: 48 cm/s
RPERPSV: 28 cm/s
RPOPDPSV: 55 cm/s
RPOPPPSV: 70 cm/s
RSFDPSV: -289 cm/s
RSFMPSV: 224 cm/s
RSFPPSV: 110 cm/s
RTIBDISTSYS: 86 cm/s

## 2016-09-22 ENCOUNTER — Telehealth: Payer: Self-pay | Admitting: *Deleted

## 2016-09-22 DIAGNOSIS — I999 Unspecified disorder of circulatory system: Secondary | ICD-10-CM

## 2016-09-22 NOTE — Telephone Encounter (Signed)
-----   Message from Albertine PatriciaStaci T Orchid Glassberg, CMA sent at 09/22/2016  8:45 AM EDT -----   ----- Message ----- From: Jonelle SidleSamuel G McDowell, MD Sent: 09/22/2016   8:09 AM To: Eustace MooreLydia M Anderson, LPN  Results reviewed. 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 Suggest PV consultation for further evaluation.  A copy of this test should be [facility] to [facility], MD.

## 2016-09-22 NOTE — Telephone Encounter (Signed)
Pt aware, referral placed for consult PV forwarded to schedulers - routed to pcp

## 2016-09-30 ENCOUNTER — Telehealth: Payer: Self-pay | Admitting: Cardiology

## 2016-09-30 NOTE — Telephone Encounter (Signed)
Mrs. Savannah Carlson has listed as her insurance company [employer].  We no longer accept this insurance.  Have tried calling patient to speak with her in regards to the referral to the [facility]. Will mail her a note asking her to contact the office.
